# Patient Record
Sex: Female | Born: 1940 | Race: Black or African American | Hispanic: No | Marital: Single | Smoking: Never smoker
Health system: Southern US, Community
[De-identification: ages and names within clinical notes are randomized; demographics above are authoritative.]

---

## 2004-04-16 ENCOUNTER — Ambulatory Visit: Payer: Self-pay

## 2005-04-08 ENCOUNTER — Ambulatory Visit: Payer: Self-pay

## 2006-04-29 ENCOUNTER — Ambulatory Visit: Payer: Self-pay | Admitting: Internal Medicine

## 2007-07-07 ENCOUNTER — Emergency Department: Payer: Self-pay | Admitting: Emergency Medicine

## 2009-06-01 ENCOUNTER — Emergency Department: Payer: Self-pay | Admitting: Emergency Medicine

## 2009-10-12 ENCOUNTER — Ambulatory Visit: Payer: Self-pay | Admitting: Family Medicine

## 2010-08-30 ENCOUNTER — Ambulatory Visit: Payer: Self-pay | Admitting: Family Medicine

## 2011-08-27 ENCOUNTER — Emergency Department: Payer: Self-pay | Admitting: Internal Medicine

## 2011-12-10 ENCOUNTER — Ambulatory Visit: Payer: Self-pay | Admitting: Family Medicine

## 2012-03-02 ENCOUNTER — Ambulatory Visit: Payer: Self-pay | Admitting: Family Medicine

## 2013-03-10 ENCOUNTER — Ambulatory Visit: Payer: Self-pay | Admitting: Family Medicine

## 2016-01-29 ENCOUNTER — Other Ambulatory Visit: Payer: Self-pay | Admitting: Family Medicine

## 2016-01-29 DIAGNOSIS — Z0000 Encounter for general adult medical examination without abnormal findings: Secondary | ICD-10-CM

## 2016-01-30 ENCOUNTER — Other Ambulatory Visit: Payer: Self-pay | Admitting: Family Medicine

## 2016-01-30 DIAGNOSIS — Z1231 Encounter for screening mammogram for malignant neoplasm of breast: Secondary | ICD-10-CM

## 2016-02-27 ENCOUNTER — Ambulatory Visit: Payer: Self-pay | Attending: Family Medicine

## 2016-04-14 ENCOUNTER — Ambulatory Visit
Admission: RE | Admit: 2016-04-14 | Discharge: 2016-04-14 | Disposition: A | Discharge status: Home / Self Care | Payer: Medicare HMO | Source: Ambulatory Visit | Attending: Family Medicine | Admitting: Family Medicine

## 2016-04-14 ENCOUNTER — Encounter: Payer: Self-pay | Admitting: Radiology

## 2016-04-14 DIAGNOSIS — Z13820 Encounter for screening for osteoporosis: Secondary | ICD-10-CM | POA: Insufficient documentation

## 2016-04-14 DIAGNOSIS — M8588 Other specified disorders of bone density and structure, other site: Secondary | ICD-10-CM | POA: Insufficient documentation

## 2016-04-14 DIAGNOSIS — Z780 Asymptomatic menopausal state: Secondary | ICD-10-CM | POA: Diagnosis not present

## 2016-04-14 DIAGNOSIS — Z1231 Encounter for screening mammogram for malignant neoplasm of breast: Secondary | ICD-10-CM | POA: Diagnosis not present

## 2016-04-14 DIAGNOSIS — Z0000 Encounter for general adult medical examination without abnormal findings: Secondary | ICD-10-CM

## 2016-04-14 DIAGNOSIS — M85852 Other specified disorders of bone density and structure, left thigh: Secondary | ICD-10-CM | POA: Insufficient documentation

## 2018-02-26 ENCOUNTER — Other Ambulatory Visit: Payer: Self-pay | Admitting: Family Medicine

## 2018-02-26 DIAGNOSIS — Z1231 Encounter for screening mammogram for malignant neoplasm of breast: Secondary | ICD-10-CM

## 2018-02-26 DIAGNOSIS — Z0000 Encounter for general adult medical examination without abnormal findings: Secondary | ICD-10-CM

## 2018-04-07 ENCOUNTER — Other Ambulatory Visit: Payer: Medicare HMO

## 2018-10-02 ENCOUNTER — Encounter: Payer: Self-pay | Admitting: Emergency Medicine

## 2018-10-02 ENCOUNTER — Emergency Department: Payer: Medicare HMO

## 2018-10-02 ENCOUNTER — Emergency Department
Admission: EM | Admit: 2018-10-02 | Discharge: 2018-10-02 | Disposition: A | Discharge status: Home / Self Care | Payer: Medicare HMO | Attending: Emergency Medicine | Admitting: Emergency Medicine

## 2018-10-02 ENCOUNTER — Other Ambulatory Visit: Payer: Self-pay

## 2018-10-02 DIAGNOSIS — W010XXA Fall on same level from slipping, tripping and stumbling without subsequent striking against object, initial encounter: Secondary | ICD-10-CM | POA: Diagnosis not present

## 2018-10-02 DIAGNOSIS — Y998 Other external cause status: Secondary | ICD-10-CM | POA: Insufficient documentation

## 2018-10-02 DIAGNOSIS — Y9259 Other trade areas as the place of occurrence of the external cause: Secondary | ICD-10-CM | POA: Insufficient documentation

## 2018-10-02 DIAGNOSIS — S8992XA Unspecified injury of left lower leg, initial encounter: Secondary | ICD-10-CM | POA: Diagnosis not present

## 2018-10-02 DIAGNOSIS — Y9301 Activity, walking, marching and hiking: Secondary | ICD-10-CM | POA: Insufficient documentation

## 2018-10-02 DIAGNOSIS — S0990XA Unspecified injury of head, initial encounter: Secondary | ICD-10-CM | POA: Diagnosis present

## 2018-10-02 DIAGNOSIS — S069X9A Unspecified intracranial injury with loss of consciousness of unspecified duration, initial encounter: Secondary | ICD-10-CM

## 2018-10-02 DIAGNOSIS — M25562 Pain in left knee: Secondary | ICD-10-CM

## 2018-10-02 MED ORDER — DICLOFENAC SODIUM 1 % TD GEL: 4.0000 g | Freq: Four times a day (QID) | TRANSDERMAL | 1 refills | Status: DC

## 2018-10-02 NOTE — ED Notes (Addendum)
Jones wrap applied to left knee per CariBeth FNP Pt called for ride home

## 2018-10-02 NOTE — ED Notes (Signed)
Pt reports that she fell in [employer] parking lot at 12 noon - pt c/o left knee pain - left knee appears bruised/slightly swollen/and painful to touch - c/o left shoulder aching - pt reports that she "flipped over" and hit back of head on the cement - denies N/V, dizziness, pain, change in vision - pt reports that she was "unconscious and everyone looking at me when I woke up"

## 2018-10-02 NOTE — ED Provider Notes (Signed)
[facility] EMERGENCY DEPARTMENT Provider Note   CSN: [id number] Arrival date & time: 10/02/18  1256    History   Chief Complaint Chief Complaint  Patient presents with   Knee Pain    HPI Erika Roman is a 78 y.o. female.     78 year old female presents to the emergency department after a mechanical fall with a brief period of loss of consciousness.  [place] [facility] [employer]  The base of the Lane divider was sticking out and she caught her foot on it causing her to fall.  She landed on her left knee.  She also states that she lost consciousness.  When she woke up there were several people standing over her, but she does not recall striking her head.  She also has some pain in the left shoulder but states it is just "sore."  No alleviating measures attempted prior to arrival.     History reviewed. No pertinent past medical history.  There are no active problems to display for this patient.   History reviewed. No pertinent surgical history.   OB History   No obstetric history on file.      Home Medications    Prior to Admission medications   Medication Sig Start Date End Date Taking? Authorizing Provider  diclofenac sodium (VOLTAREN) 1 % GEL Apply 4 g topically 4 (four) times daily. 10/02/18   Chinita Pesterriplett, Shawntia Mangal B, FNP    Family History Family History  Problem Relation Age of Onset   Breast cancer Neg Hx     Social History Social History   Tobacco Use   Smoking status: Not on file  Substance Use Topics   Alcohol use: Not on file   Drug use: Not on file     Allergies   Patient has no known allergies.   Review of Systems Review of Systems  Constitutional: Negative.   HENT: Negative for ear discharge and nosebleeds.   Respiratory: Negative for shortness of breath.   Cardiovascular: Negative for chest pain.  Gastrointestinal: Negative for nausea and vomiting.  Musculoskeletal: Positive for joint swelling and  myalgias. Negative for back pain and neck pain.  Skin: Positive for wound.  Neurological: Positive for syncope.  Psychiatric/Behavioral: Negative for confusion.     Physical Exam Updated Vital Signs BP 132/71    Pulse 75    Temp 99 F (37.2 C) (Oral)    Resp 20    Ht 5\' 3"  (1.6 m)    Wt 61.2 kg    SpO2 97%    BMI 23.91 kg/m   Physical Exam Vitals signs and nursing note reviewed.  Constitutional:      General: She is not in acute distress. HENT:     Head: Normocephalic.     Right Ear: Tympanic membrane normal.     Left Ear: Tympanic membrane normal.     Nose: Nose normal.     Mouth/Throat:     Mouth: Mucous membranes are moist.  Eyes:     Extraocular Movements: Extraocular movements intact.     Pupils: Pupils are equal, round, and reactive to light.  Neck:     Musculoskeletal: Normal range of motion. No neck rigidity or muscular tenderness.  Cardiovascular:     Rate and Rhythm: Normal rate.  Pulmonary:     Effort: Pulmonary effort is normal.     Breath sounds: Normal breath sounds.  Abdominal:     Palpations: Abdomen is soft.  Tenderness: There is no abdominal tenderness. There is no guarding.  Musculoskeletal:     Left knee: She exhibits decreased range of motion, swelling and ecchymosis.  Skin:    General: Skin is warm and dry.     Capillary Refill: Capillary refill takes less than 2 seconds.     Findings: Abrasion and bruising present.       Neurological:     Mental Status: She is alert.  Psychiatric:        Mood and Affect: Mood normal.        Behavior: Behavior normal.        Thought Content: Thought content normal.      ED Treatments / Results  Labs (all labs ordered are listed, but only abnormal results are displayed) Labs Reviewed - No data to display  EKG None  Radiology Ct Head Wo Contrast  Result Date: 10/02/2018 CLINICAL DATA:  Posttraumatic headache EXAM: CT HEAD WITHOUT CONTRAST CT CERVICAL SPINE WITHOUT CONTRAST TECHNIQUE:  Multidetector CT imaging of the head and cervical spine was performed following the standard protocol without intravenous contrast. Multiplanar CT image reconstructions of the cervical spine were also generated. COMPARISON:  None. FINDINGS: CT HEAD FINDINGS Brain: No evidence of acute infarction, hemorrhage, hydrocephalus, extra-axial collection or mass lesion/mass effect. Widened parieto-occipital and left frontal sulci, nonspecific. No evidence of subdural collection. Vascular: No hyperdense vessel or unexpected calcification. Skull: Normal. Negative for fracture or focal lesion. Sinuses/Orbits: No acute finding. Other: Pervasive streak artifact from hair pins which the patient declined to remove. These definitely degrades sensitivity. CT CERVICAL SPINE FINDINGS Alignment: Mild C6-7 anterolisthesis considered degenerative. Facets are located. Skull base and vertebrae: Negative for acute fracture Soft tissues and spinal canal: No prevertebral fluid or swelling. No visible canal hematoma. Or Disc levels: Generalized degenerative spurring and disc narrowing. Degenerative facet spurring asymmetric to the left. Upper chest: Negative IMPRESSION: 1. No evidence of intracranial injury or cervical spine fracture. 2. Extensive head CT artifact due to hair pins which the patient declined to remove. Electronically Signed   By: Marnee Spring M.D.   On: 10/02/2018 15:07   Ct Cervical Spine Wo Contrast  Result Date: 10/02/2018 CLINICAL DATA:  Posttraumatic headache EXAM: CT HEAD WITHOUT CONTRAST CT CERVICAL SPINE WITHOUT CONTRAST TECHNIQUE: Multidetector CT imaging of the head and cervical spine was performed following the standard protocol without intravenous contrast. Multiplanar CT image reconstructions of the cervical spine were also generated. COMPARISON:  None. FINDINGS: CT HEAD FINDINGS Brain: No evidence of acute infarction, hemorrhage, hydrocephalus, extra-axial collection or mass lesion/mass effect. Widened  parieto-occipital and left frontal sulci, nonspecific. No evidence of subdural collection. Vascular: No hyperdense vessel or unexpected calcification. Skull: Normal. Negative for fracture or focal lesion. Sinuses/Orbits: No acute finding. Other: Pervasive streak artifact from hair pins which the patient declined to remove. These definitely degrades sensitivity. CT CERVICAL SPINE FINDINGS Alignment: Mild C6-7 anterolisthesis considered degenerative. Facets are located. Skull base and vertebrae: Negative for acute fracture Soft tissues and spinal canal: No prevertebral fluid or swelling. No visible canal hematoma. Or Disc levels: Generalized degenerative spurring and disc narrowing. Degenerative facet spurring asymmetric to the left. Upper chest: Negative IMPRESSION: 1. No evidence of intracranial injury or cervical spine fracture. 2. Extensive head CT artifact due to hair pins which the patient declined to remove. Electronically Signed   By: Marnee Spring M.D.   On: 10/02/2018 15:07   Dg Knee Complete 4 Views Left  Result Date: 10/02/2018 CLINICAL DATA:  Fall today,  left knee pain EXAM: LEFT KNEE - COMPLETE 4+ VIEW COMPARISON:  None. FINDINGS: Lateral and prepatellar left knee soft tissue swelling. No fracture, joint effusion or dislocation. No suspicious focal osseous lesion. No significant arthropathy. Small superior left patellar enthesophyte. No radiopaque foreign body. IMPRESSION: Lateral and prepatellar left knee soft tissue swelling, with no fracture, joint effusion or malalignment. Electronically Signed   By: Delbert Phenix M.D.   On: 10/02/2018 14:23    Procedures Procedures (including critical care time)  Medications Ordered in ED Medications - No data to display   Initial Impression / Assessment and Plan / ED Course  I have reviewed the triage vital signs and the nursing notes.  Pertinent labs & imaging results that were available during my care of the patient were reviewed by me and  considered in my medical decision making (see chart for details).      78 year old female presenting to the emergency department status post syncope secondary to mechanical fall.  Patient denies chest pain, shortness of breath, or weakness in the extremities.  No neurological deficits that would indicate a more sinister reason for the syncopal episode.  She is currently awake, alert, and oriented x4.  Her main complaint is left knee pain.  CT of the head and cervical spine have been ordered as well as diagnostic imaging of the left knee.  ----------------------------------------- 3:28 PM on 10/02/2018 -----------------------------------------  CT of the head is somewhat skewed due to artifact related to hairpins. Patient adamantly refuses to remove the pins.  At this time, the patient is alert, oriented x4 and is now denying headache.  Her exam is reassuring.  She will be discharged home as originally planned and will receive head injury instructions as well as strict ER return precautions.  She has been advised not to be alone for the next 24 hours.  For any symptom of concern she is to return to the emergency department.  Her left knee was placed in a Jones wrap and she is to rest, ice, and elevate it.  If not improving over the week.  She is to see her primary care provider.  She will be prescribed Voltaren gel to be used 4 times a day if needed for pain.  Final Clinical Impressions(s) / ED Diagnoses   Final diagnoses:  Acute pain of left knee  Acute head injury with loss of consciousness, initial encounter [facility])    ED Discharge Orders         Ordered    diclofenac sodium (VOLTAREN) 1 % GEL  4 times daily     10/02/18 1521           Chinita Pester, FNP 10/02/18 1532    Sharyn Creamer, MD 10/02/18 1605

## 2018-10-02 NOTE — Discharge Instructions (Signed)
Please call and schedule follow-up appointment with your primary care provider for symptoms that are not improving over the next week or so.  It is not recommended that you stay alone for the next 24 hours due to the loss of consciousness when you fell.  Return to the emergency department immediately for any concerns.

## 2018-10-02 NOTE — ED Triage Notes (Signed)
L knee pain since fell approx 1 hour ago.

## 2019-12-09 IMAGING — CT CT HEAD WITHOUT CONTRAST
4 of 8 series · 13 of 47 positions shown, 14 images · non-contrast
Comparison: None.

CLINICAL DATA: Posttraumatic headache

EXAM:
CT HEAD WITHOUT CONTRAST
CT CERVICAL SPINE WITHOUT CONTRAST
TECHNIQUE: Multidetector CT imaging of the head and cervical spine was
performed following the standard protocol without intravenous
contrast. Multiplanar CT image reconstructions of the cervical spine
were also generated.

[Series 2: head (person_name) (person_name) · axial · 0.45mm/px · z∈[-55,+20]mm · 3 of 31 slices shown, 4 images]
[im 8/31  brain]
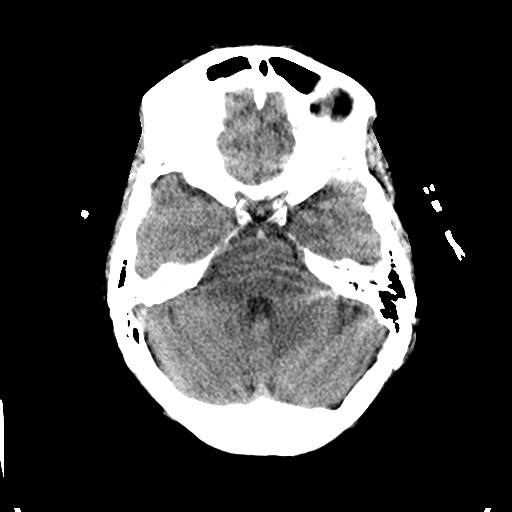
[im 8/31  bone]
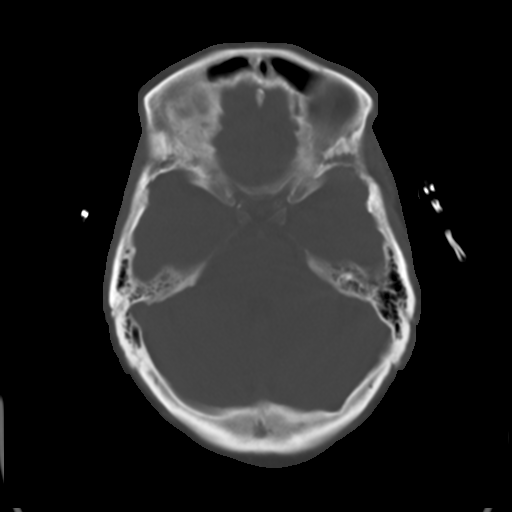
[im 16/31  brain]
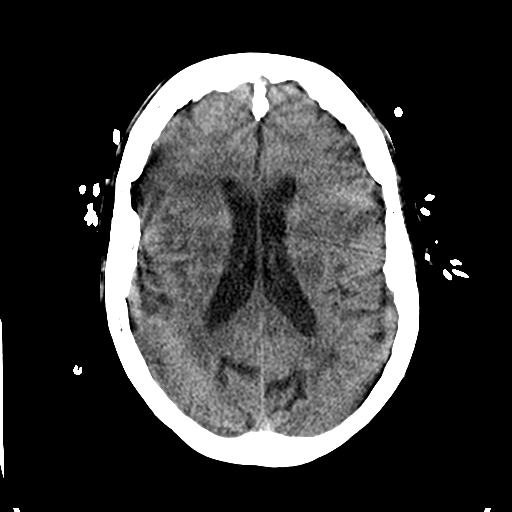
[im 23/31  brain]
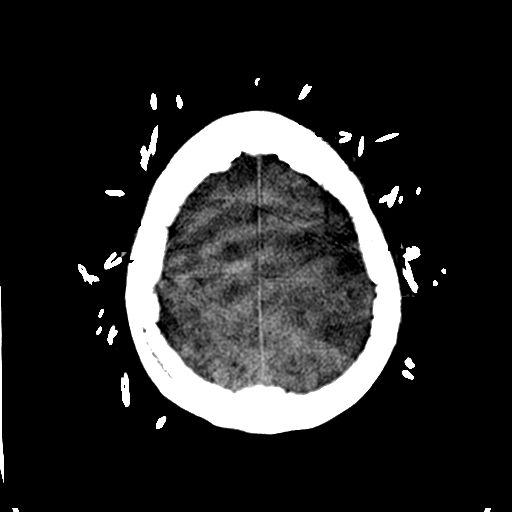

[Series 5: sagittal soft tissue · sagittal · 0.29mm/px · 2 of 48 slices shown]
[im 16/48  brain]
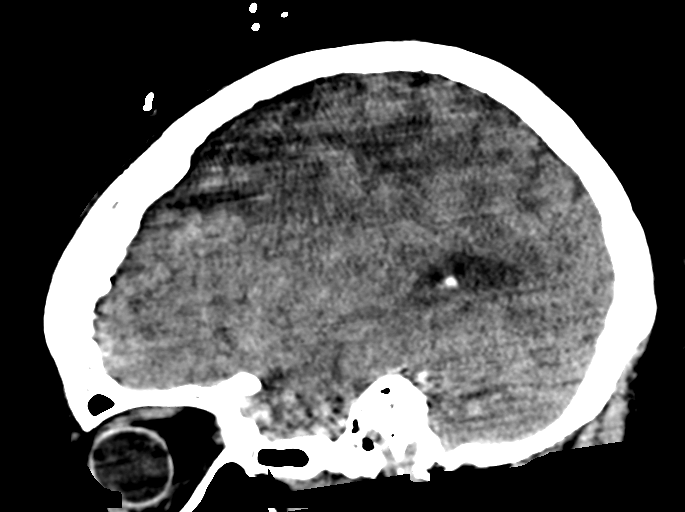
[im 32/48  brain]
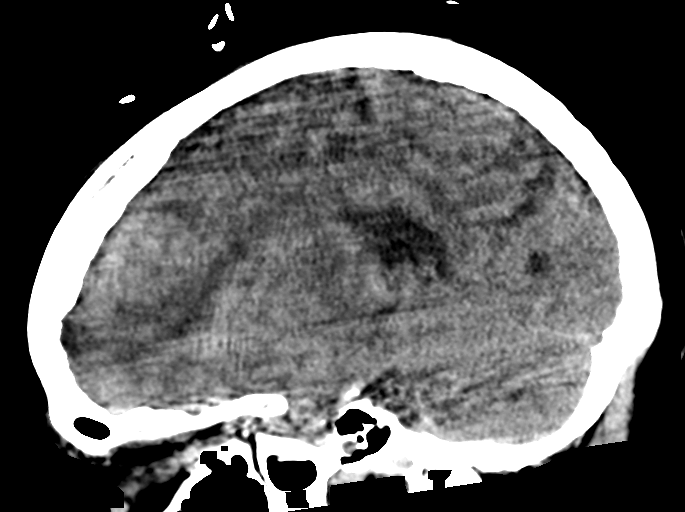

[Series 9: coronal bone · coronal · 0.19mm/px · 3 of 38 slices shown]
[im 8/38  brain]
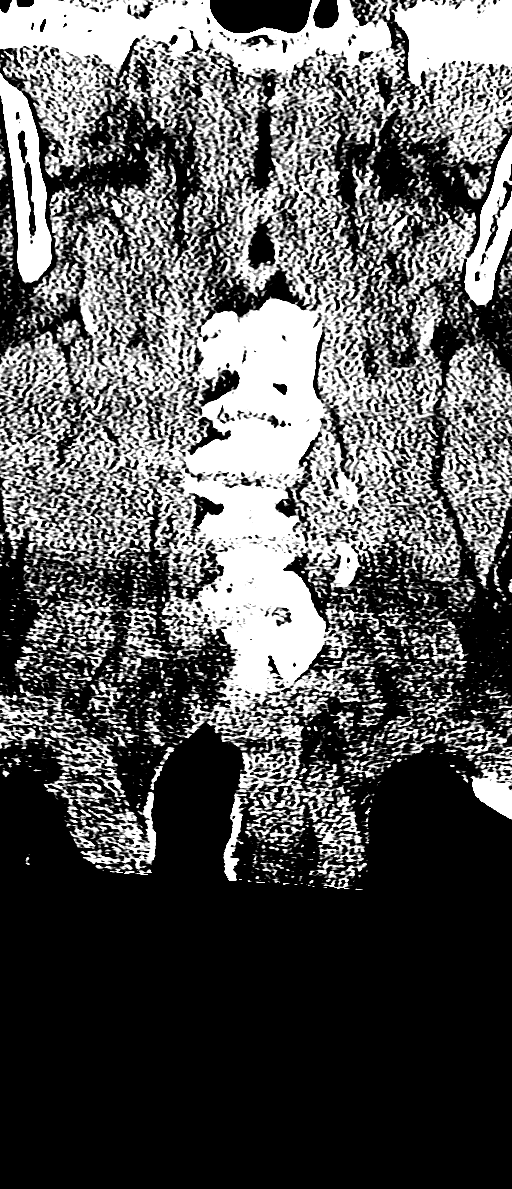
[im 15/38  brain]
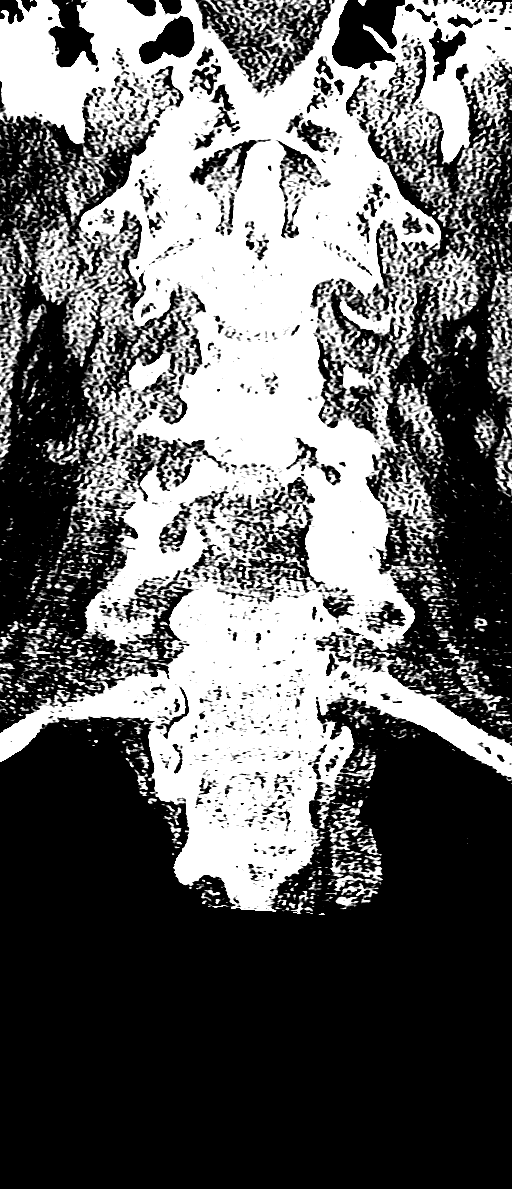
[im 23/38  brain]
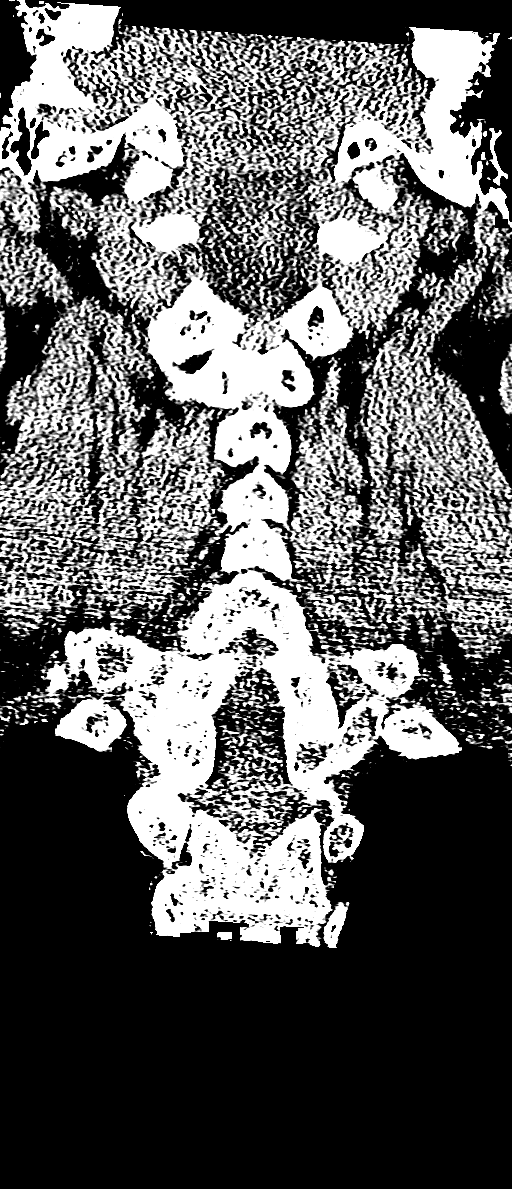

[Series 10: orthogonal bone · axial · 0.21mm/px · z∈[-262,-178]mm · 5 of 91 slices shown]
[im 8/91  bone]
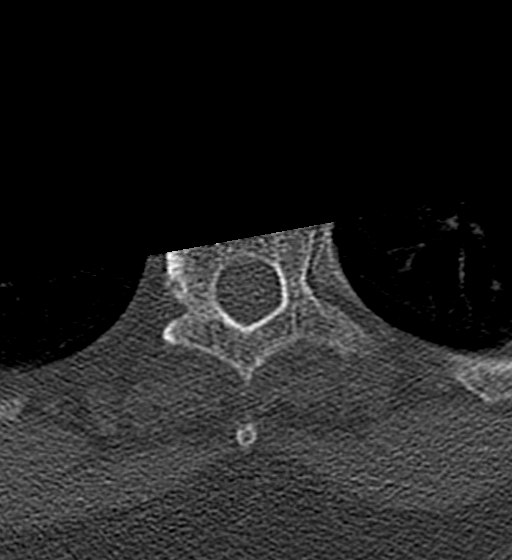
[im 23/91  bone]
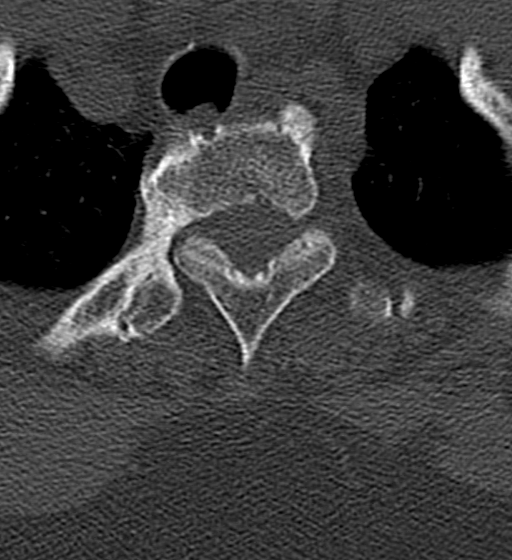
[im 31/91  bone]
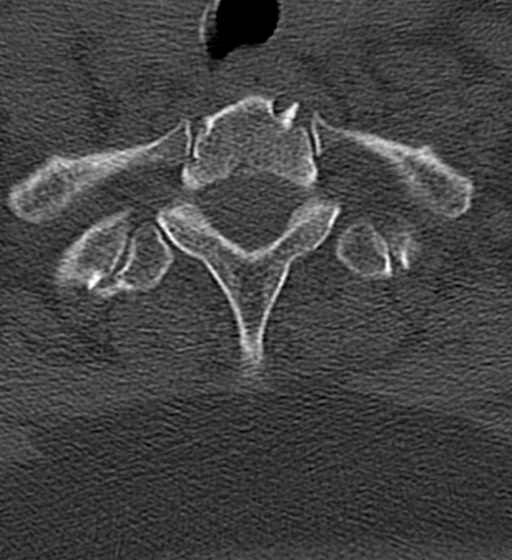
[im 38/91  bone]
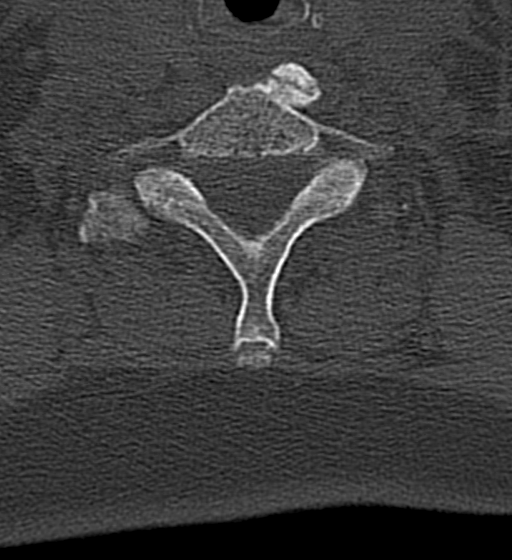
[im 53/91  bone]
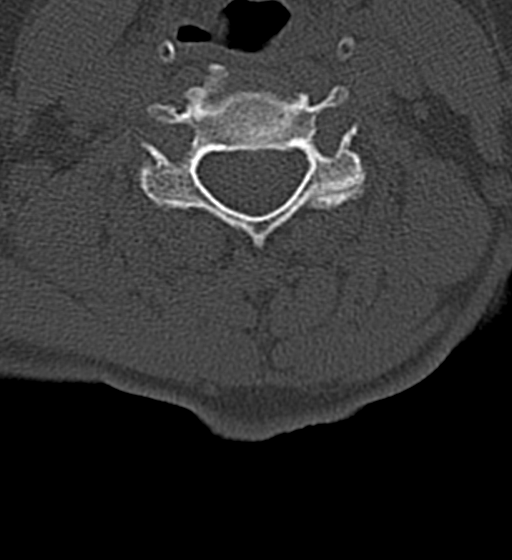

[13 of 47 positions shown; findings below may reference images not displayed]

FINDINGS: CT HEAD FINDINGS

Brain: No evidence of acute infarction, hemorrhage, hydrocephalus,
extra-axial collection or mass lesion/mass effect. Widened
parieto-occipital and left frontal sulci, nonspecific. No evidence
of subdural collection.

Vascular: No hyperdense vessel or unexpected calcification.

Skull: Normal. Negative for fracture or focal lesion.

Sinuses/Orbits: No acute finding.

Other: Pervasive streak artifact from hair pins which the patient
declined to remove. These definitely degrades sensitivity.

CT CERVICAL SPINE FINDINGS

Alignment: Mild C6-7 anterolisthesis considered degenerative. Facets
are located.

Skull base and vertebrae: Negative for acute fracture

Soft tissues and spinal canal: No prevertebral fluid or swelling. No
visible canal hematoma. Or

Disc levels: Generalized degenerative spurring and disc narrowing.
Degenerative facet spurring asymmetric to the left.

Upper chest: Negative
IMPRESSION: 1. No evidence of intracranial injury or cervical spine fracture.
2. Extensive head CT artifact due to hair pins which the patient
declined to remove.

## 2021-11-30 DIAGNOSIS — Z1389 Encounter for screening for other disorder: Secondary | ICD-10-CM | POA: Diagnosis not present

## 2021-11-30 DIAGNOSIS — M79609 Pain in unspecified limb: Secondary | ICD-10-CM | POA: Diagnosis not present

## 2021-12-29 ENCOUNTER — Other Ambulatory Visit: Payer: Self-pay

## 2021-12-29 ENCOUNTER — Encounter: Payer: Self-pay | Admitting: Emergency Medicine

## 2021-12-29 ENCOUNTER — Inpatient Hospital Stay
Admission: EM | Admit: 2021-12-29 | Discharge: 2021-12-31 | DRG: 556 | Disposition: A | Discharge status: Home / Self Care | Payer: Medicare HMO | Attending: Internal Medicine | Admitting: Internal Medicine

## 2021-12-29 ENCOUNTER — Emergency Department: Payer: Medicare HMO

## 2021-12-29 DIAGNOSIS — M79661 Pain in right lower leg: Secondary | ICD-10-CM | POA: Diagnosis not present

## 2021-12-29 DIAGNOSIS — E785 Hyperlipidemia, unspecified: Secondary | ICD-10-CM | POA: Diagnosis present

## 2021-12-29 DIAGNOSIS — I743 Embolism and thrombosis of arteries of the lower extremities: Secondary | ICD-10-CM | POA: Diagnosis not present

## 2021-12-29 DIAGNOSIS — I739 Peripheral vascular disease, unspecified: Secondary | ICD-10-CM | POA: Diagnosis present

## 2021-12-29 DIAGNOSIS — M79606 Pain in leg, unspecified: Secondary | ICD-10-CM | POA: Diagnosis present

## 2021-12-29 DIAGNOSIS — I771 Stricture of artery: Secondary | ICD-10-CM | POA: Diagnosis present

## 2021-12-29 DIAGNOSIS — I70211 Atherosclerosis of native arteries of extremities with intermittent claudication, right leg: Secondary | ICD-10-CM | POA: Diagnosis not present

## 2021-12-29 DIAGNOSIS — I998 Other disorder of circulatory system: Secondary | ICD-10-CM | POA: Diagnosis not present

## 2021-12-29 DIAGNOSIS — M79604 Pain in right leg: Principal | ICD-10-CM

## 2021-12-29 DIAGNOSIS — M79671 Pain in right foot: Secondary | ICD-10-CM | POA: Diagnosis present

## 2021-12-29 DIAGNOSIS — R269 Unspecified abnormalities of gait and mobility: Secondary | ICD-10-CM | POA: Diagnosis not present

## 2021-12-29 DIAGNOSIS — I10 Essential (primary) hypertension: Secondary | ICD-10-CM | POA: Diagnosis not present

## 2021-12-29 LAB — APTT: aPTT: 32 seconds (ref 24–36)

## 2021-12-29 LAB — CBC WITH DIFFERENTIAL/PLATELET
Abs Immature Granulocytes: 0.01 10*3/uL (ref 0.00–0.07)
Basophils Absolute: 0 10*3/uL (ref 0.0–0.1)
Basophils Relative: 0 %
Eosinophils Absolute: 0.1 10*3/uL (ref 0.0–0.5)
Eosinophils Relative: 2 %
HCT: 38.8 % (ref 36.0–46.0)
Hemoglobin: 12.5 g/dL (ref 12.0–15.0)
Immature Granulocytes: 0 %
Lymphocytes Relative: 38 %
Lymphs Abs: 1.9 10*3/uL (ref 0.7–4.0)
MCH: 30 pg (ref 26.0–34.0)
MCHC: 32.2 g/dL (ref 30.0–36.0)
MCV: 93 fL (ref 80.0–100.0)
Monocytes Absolute: 0.3 10*3/uL (ref 0.1–1.0)
Monocytes Relative: 5 %
Neutro Abs: 2.7 10*3/uL (ref 1.7–7.7)
Neutrophils Relative %: 55 %
Platelets: 319 10*3/uL (ref 150–400)
RBC: 4.17 MIL/uL (ref 3.87–5.11)
RDW: 12.5 % (ref 11.5–15.5)
WBC: 5 10*3/uL (ref 4.0–10.5)
nRBC: 0 % (ref 0.0–0.2)

## 2021-12-29 LAB — PROTIME-INR
INR: 1 (ref 0.8–1.2)
Prothrombin Time: 13.5 seconds (ref 11.4–15.2)

## 2021-12-29 LAB — BASIC METABOLIC PANEL
Anion gap: 10 (ref 5–15)
BUN: 12 mg/dL (ref 8–23)
CO2: 27 mmol/L (ref 22–32)
Calcium: 9.5 mg/dL (ref 8.9–10.3)
Chloride: 104 mmol/L (ref 98–111)
Creatinine, Ser: 0.59 mg/dL (ref 0.44–1.00)
GFR, Estimated: >60 mL/min (ref >60)
Glucose, Bld: 99 mg/dL (ref 70–99)
Potassium: 3.7 mmol/L (ref 3.5–5.1)
Sodium: 141 mmol/L (ref 135–145)

## 2021-12-29 MED ORDER — HEPARIN (PORCINE) 25000 UT/250ML-% IV SOLN
1000.0000 [IU]/h | INTRAVENOUS | Status: DC
  Administered 2021-12-29: 1000 [IU]/h via INTRAVENOUS
  Filled 2021-12-29: qty 250

## 2021-12-29 MED ORDER — IOHEXOL 350 MG/ML SOLN
100.0000 mL | Freq: Once | INTRAVENOUS | Status: AC | PRN
  Administered 2021-12-29: 100 mL via INTRAVENOUS

## 2021-12-29 MED ORDER — MORPHINE SULFATE (PF) 2 MG/ML IV SOLN
2.0000 mg | Freq: Once | INTRAVENOUS | Status: AC
  Administered 2021-12-29: 2 mg via INTRAVENOUS
  Filled 2021-12-29: qty 1

## 2021-12-29 MED ORDER — HEPARIN BOLUS VIA INFUSION
3500.0000 [IU] | Freq: Once | INTRAVENOUS | Status: AC
  Administered 2021-12-29: 3500 [IU] via INTRAVENOUS
  Filled 2021-12-29: qty 3500

## 2021-12-29 NOTE — Consult Note (Signed)
VASCULAR SURGERY CONSULTATION   Requested by:  Alfonse Flavors, MD [facility] ED)  Reason for consultation: RIGHT leg pain    History of Present Illness   Erika Roman is a 81 y.o. ([birth year]) female who presents with cc: RIGHT leg pain.  Pt notes sudden onset of pain RIGHT lower leg roughly 3 am.  Pt had woke up to go to the bathroom when she noted a severe pain in RIGHT lower leg.  Pt denies neuropathic sx.  Pt notes a vague "toothache" like pain.  Pt denies any motor or sensation loss.  Pt denies any prior history of claudication or rest pain.  Pt denies any recent cardiac arrhythmia.  History reviewed. No pertinent past medical history.  History reviewed. No pertinent surgical history.   Social History   Socioeconomic History   Marital status: Single    Spouse name: Not on file   Number of children: Not on file   Years of education: Not on file   Highest education level: Not on file  Occupational History   Not on file  Tobacco Use   Smoking status: Not on file   Smokeless tobacco: Not on file  Substance and Sexual Activity   Alcohol use: Not on file   Drug use: Not on file   Sexual activity: Not on file  Other Topics Concern   Not on file  Social History Narrative   Not on file   Social Determinants of Health   Financial Resource Strain: Not on file  Food Insecurity: Not on file  Transportation Needs: Not on file  Physical Activity: Not on file  Stress: Not on file  Social Connections: Not on file  Intimate Partner Violence: Not on file    Family History  Problem Relation Age of Onset   Breast cancer Neg Hx     Current Facility-Administered Medications  Medication Dose Route Frequency Provider Last Rate Last Admin   heparin ADULT infusion 100 units/mL (25000 units/282mL)  1,000 Units/hr Intravenous Continuous Jaynie Bream, RPH       heparin bolus via infusion 3,500 Units  3,500 Units Intravenous Once Jaynie Bream, [facility]       Current  Outpatient Medications  Medication Sig Dispense Refill   diclofenac sodium (VOLTAREN) 1 % GEL Apply 4 g topically 4 (four) times daily. 1 Tube 1    No Known Allergies  REVIEW OF SYSTEMS (negative unless checked):   Cardiac:  []  Chest pain or chest pressure? []  Shortness of breath upon activity? []  Shortness of breath when lying flat? [x]  Irregular heart rhythm?  Vascular:  [x]  Pain in calf, thigh, or hip brought on by walking? []  Pain in feet at night that wakes you up from your sleep? []  Blood clot in your veins? []  Leg swelling?  Pulmonary:  []  Oxygen at home? []  Productive cough? []  Wheezing?  Neurologic:  []  Sudden weakness in arms or legs? []  Sudden numbness in arms or legs? []  Sudden onset of difficult speaking or slurred speech? []  Temporary loss of vision in one eye? []  Problems with dizziness?  Gastrointestinal:  []  Blood in stool? []  Vomited blood?  Genitourinary:  []  Burning when urinating? []  Blood in urine?  Psychiatric:  []  Major depression  Hematologic:  []  Bleeding problems? []  Problems with blood clotting?  Dermatologic:  []  Rashes or ulcers?  Constitutional:  []  Fever or chills?  Ear/Nose/Throat:  []  Change in hearing? []  Nose bleeds? []  Sore throat?  Musculoskeletal:  []  Back pain? [x]  Joint pain? [x]  Muscle pain?   Physical Examination     Vitals:   12/29/21 1257 12/29/21 1849 12/29/21 1900 12/29/21 1930  BP: (!) 173/85  (!) 196/80 (!) 167/69  Pulse: 79  (!) 56 (!) 54  Resp: 20  14 18   Temp: 98.3 F (36.8 C) 98.3 F (36.8 C)    TempSrc: Oral Oral    SpO2: 96%  96% 96%  Weight: 56.7 kg     Height: 5\' 3"  (1.6 m)      Body mass index is 22.14 kg/m.  General Alert, O x 3, WD, NAD  Head [place]/AT,    Ear/Nose/ Throat Hearing grossly intact, nares without erythema or drainage, oropharynx without Erythema or Exudate, Mallampati score: 3,   Eyes PERRLA, EOMI,    Neck Supple, mid-line trachea,    Pulmonary Sym exp, good  B air movt, CTA B  Cardiac RRR, Nl S1, S2, no Murmurs, No rubs, No S3,S4  Vascular Vessel Right Left  Radial Palpable Palpable  Brachial Palpable Palpable  Carotid Palpable, No Bruit Palpable, No Bruit  Aorta Not palpable N/A  Femoral Palpable Palpable  Popliteal Not palpable Not palpable  PT Palpable Palpable  DP Not palpable Palpable    Gastro- intestinal soft, non-distended, non-tender to palpation, No guarding or rebound, no HSM, no masses, no CVAT B, No palpable prominent aortic pulse,    Musculo- skeletal M/S 5/5 throughout  , Extremities without ischemic changes  , No edema present,  Neurologic Cranial nerves 2-12 intact , Pain and light touch intact in extremities , Motor exam as listed above  Psychiatric Judgement intact, Mood & affect appropriate for pt's clinical situation  Dermatologic See M/S exam for extremity exam, No rashes otherwise noted  Lymphatic  Palpable lymph nodes: None    Laboratory   CBC    Latest Ref Rng & Units 12/29/2021    6:45 PM  CBC  WBC 4.0 - 10.5 K/uL 5.0   Hemoglobin 12.0 - 15.0 g/dL 12/31/21   Hematocrit 12/31/21 - 46.0 % 38.8   Platelets 150 - 400 K/uL 319     BMP    Latest Ref Rng & Units 12/29/2021    6:45 PM  BMP  Glucose 70 - 99 mg/dL 99   BUN 8 - 23 mg/dL 12   Creatinine - 1.00 mg/dL 12/31/2021   Sodium 83.3 - 82.5 mmol/L 141   Potassium 3.5 - 5.1 mmol/L 3.7   Chloride 98 - 111 mmol/L 104   CO2 22 - 32 mmol/L 27   Calcium 8.9 - 10.3 mg/dL 9.5     Coagulation Lab Results  Component Value Date   INR 1.0 12/29/2021    Radiology     CT ANGIO LOW EXTREM RIGHT W &/OR WO CONTRAST  Result Date: 12/29/2021 CLINICAL DATA:  Claudication of right leg. EXAM: CT ANGIOGRAPHY LOWER RIGHT EXTREMITY TECHNIQUE: Multi detector CT imaging of the right lower extremity was performed using the standard protocol during bolus administration of intravenous contrast. Multiplanar CT image reconstruction and MIPS were obtained to evaluate the vascular  anatomy. CONTRAST:  9.76 OMNIPAQUE IOHEXOL 350 MG/ML SOLN COMPARISON:  None Available. FINDINGS: There is no evidence of an acute fracture, dislocation or joint effusion. Right common femoral artery: Free of significant atherosclerosis. The right common femoral bifurcates into the superficial femoral and deep (profundus) femoral arteries. Right Profundus femoris: Free of significant atherosclerosis. Right Superficial femoral: Free of significant atherosclerosis. Right Popliteal artery: Free  of significant atherosclerosis. The popliteal artery bifurcates into the anterior tibial and tibioperoneal trunk. Right tibioperoneal trunk: Free of significant atherosclerosis. Right anterior tibial artery: Segments of distal hemodynamically significant stenosis and arterial occlusion are noted (axial CT images 329 through 481, CT series 5). Right posterior tibial artery: Free of significant atherosclerosis, continuous to the ankle. Right peroneal artery: Free of significant atherosclerosis, continuous to the ankle. Review of the MIP images confirms the above findings. IMPRESSION: 1. 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 Further evaluation with conventional angiography is recommended. Electronically Signed   By: Aram Candela M.D.   On: 12/29/2021 20:46   US Venous Img Lower Unilateral Right  Result Date: 12/29/2021 CLINICAL DATA:  Right calf pain. EXAM: RIGHT LOWER EXTREMITY VENOUS DOPPLER ULTRASOUND TECHNIQUE: Gray-scale sonography with compression, as well as color and duplex ultrasound, were performed to evaluate the deep venous system(s) from the level of the common femoral vein through the popliteal and proximal calf veins. COMPARISON:  None Available. FINDINGS: VENOUS Normal compressibility of the common femoral, superficial femoral, and popliteal veins, as well as the visualized calf veins. Visualized portions of profunda femoral vein and great  saphenous vein unremarkable. No filling defects to suggest DVT on grayscale or color Doppler imaging. Doppler waveforms show normal direction of venous flow, normal respiratory plasticity and response to augmentation. Limited views of the contralateral common femoral vein are unremarkable. OTHER None. Limitations: none IMPRESSION: Negative. Electronically Signed   By: Beckie Roman M.D.   On: 12/29/2021 14:59    I reviewed pt's RLE CTA: - no femoropopliteal occlusion - 2 out 3 tibial arteries intact - Loss of ATA distally   Medical Decision Making   Erika Roman is a 81 y.o. female who presents with: RLE pain, likely distal ATA occlusion  Pt has 2 out of 3 tibial arteries patent with intact motor and sensation. She has NO evidence of any threatened limb or hemodynamically significant PAD. Unclear to me etiology of her RIGHT leg pain. Dr. Wyn Quaker will be back in the AM and can provide a second opinion At this point, I doubt any benefit to R leg runoff  Leonides Sake, MD, FACS, FSVS Covering for [facility]: [phone]  12/29/2021, 9:43 PM

## 2021-12-29 NOTE — ED Provider Triage Note (Signed)
Emergency Medicine Provider Triage Evaluation Note  Erika Roman , a 81 y.o. female  was evaluated in triage.  Pt complains of right calf pain for the last 3 days.  Patient denies any injury, trauma, or falls.  She reported to a local urgent care prior to arrival.  She presents to the ED with concern for possible DVT but she denies any chest pain, shortness of breath, cough, or hemoptysis.  Review of Systems  Positive: RLE edema/pain Negative: CP, SOB  Physical Exam  BP (!) 173/85 (BP Location: Left Arm)   Pulse 79   Temp 98.3 F (36.8 C) (Oral)   Resp 20   Ht 5\' 3"  (1.6 m)   Wt 56.7 kg   SpO2 96%   BMI 22.14 kg/m  Gen:   Awake, no distress  NAD Resp:  Normal effort CTA MSK:   Moves extremities without difficulty  CVS:  RRR  Medical Decision Making  Medically screening exam initiated at 2:04 PM.  Appropriate orders placed.  Erika Roman was informed that the remainder of the evaluation will be completed by another provider, this initial triage assessment does not replace that evaluation, and the importance of remaining in the ED until their evaluation is complete.  Geriatric patient to the ED for evaluation of 3 days of right leg pain patient presents with concern for DVT.   Merri Ray, PA-C 12/29/21 1405

## 2021-12-29 NOTE — ED Triage Notes (Signed)
Pt reports pain to right leg for 3 days, denies injuries. Pt went to UC and was advised to come to the ED to r/o DVT

## 2021-12-29 NOTE — Progress Notes (Signed)
ANTICOAGULATION CONSULT NOTE - Initial Consult  Pharmacy Consult for IV heparin Indication:  ischemic limb  No Known Allergies  Patient Measurements: Height: 5\' 3"  (160 cm) Weight: 56.7 kg (125 lb) IBW/kg (Calculated) : 52.4 Heparin Dosing Weight: 56.7 kg  Vital Signs: Temp: 98.3 F (36.8 C) (08/27 1849) Temp Source: Oral (08/27 1849) BP: 167/69 (08/27 1930) Pulse Rate: 54 (08/27 1930)  Labs: Recent Labs    12/29/21 1845  HGB 12.5  HCT 38.8  PLT 319  LABPROT 13.5  INR 1.0  CREATININE 0.59    Estimated Creatinine Clearance: 45.6 mL/min (by C-G formula based on SCr of 0.59 mg/dL).   Medical History: History reviewed. No pertinent past medical history.  Medications:  (Not in a hospital admission)  Scheduled:   heparin  3,500 Units Intravenous Once   Infusions:   heparin     Not on PTA anticoagulation per my chart review   Assessment: 81 year old female presenting with right leg pain.Starting heparin for ischemic limb. Baseline labs stable.  Goal of Therapy:  Heparin level 0.3-0.7 units/ml Monitor platelets by anticoagulation protocol: Yes   Plan:  Give 3500 units bolus x 1 Start heparin infusion at 1,000 units/hr Check anti-Xa level in 8 hours and daily while on heparin Continue to monitor H&H and platelets  94 12/29/2021,9:18 PM

## 2021-12-29 NOTE — H&P (Signed)
History and Physical    Erika Roman IWP:[id number] DOB: Nov 29, 1940 DOA: 12/29/2021  PCP: [facility]  Patient coming from: home  I have personally briefly reviewed patient's old medical records in [facility]  Chief Complaint: right leg pain x 2 days  HPI: Erika Roman is a 81 y.o. female with no significant medical history who presents with right lower extremity pain x 2-3 days with worsening symptoms over the last 24 hours. Patient notes no history of trauma but notes pain made it difficult to ambulate or complete ADLS. She notes not associated swelling or redness, no fever/ chills/ sob/ chest pain n/v. She denies prior history likes this. Does note mild pain with walking over the last few weeks but nothing as severe as the last few days.  She denies tobacco abuse, she denies any personal history of cva/cardiac disease / clots or family history of such.    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 At that time vascular Dr Wyn Quaker consulted who recommended heparin with plans for vascular intervention in am.   Vitals: afeb, bp 173/85, hr 79, rr 20  sat 96% on ra   U/s right lower ext  Neg dvt  CTA IMPRESSION: 1. 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 Further evaluation with conventional angiography is recommended.   Labs: Wbc 5, hgb 12.4, plt319,  NA 141, K 3.7, cr 0.59   Tx morphine, heparin  Review of Systems: As per HPI otherwise 10 point review of systems negative.  History reviewed. No pertinent past medical history.  History reviewed. No pertinent surgical history.   has no history on file for tobacco use, alcohol use, and drug use.  No Known Allergies  Family History  Problem Relation Age of Onset   Breast cancer Neg Hx     Prior to Admission  medications   Medication Sig Start Date End Date Taking? Authorizing Provider  diclofenac sodium (VOLTAREN) 1 % GEL Apply 4 g topically 4 (four) times daily. 10/02/18   Chinita Pester, FNP    Physical Exam: Vitals:   12/29/21 1257 12/29/21 1849 12/29/21 1900 12/29/21 1930  BP: (!) 173/85  (!) 196/80 (!) 167/69  Pulse: 79  (!) 56 (!) 54  Resp: 20  14 18   Temp: 98.3 F (36.8 C) 98.3 F (36.8 C)    TempSrc: Oral Oral    SpO2: 96%  96% 96%  Weight: 56.7 kg     Height: 5\' 3"  (1.6 m)        Vitals:   12/29/21 1257 12/29/21 1849 12/29/21 1900 12/29/21 1930  BP: (!) 173/85  (!) 196/80 (!) 167/69  Pulse: 79  (!) 56 (!) 54  Resp: 20  14 18   Temp: 98.3 F (36.8 C) 98.3 F (36.8 C)    TempSrc: Oral Oral    SpO2: 96%  96% 96%  Weight: 56.7 kg     Height: 5\' 3"  (1.6 m)     Constitutional: NAD, calm, comfortable Eyes: PERRL, lids and conjunctivae normal ENMT: Mucous membranes are moist. Posterior pharynx clear of any exudate or lesions. Neck: normal, supple, no masses, no thyromegaly Respiratory: clear to auscultation bilaterally, no wheezing, no crackles. Normal respiratory effort. No accessory muscle use.  Cardiovascular: Regular rate and rhythm, no murmurs / rubs / gallops. No extremity edema. 2+ pedal pulses on left,  no palpable dorsal pedis on right.  Right foot cool but not specifically cyanotic  Abdomen: no tenderness, no masses palpated. No hepatosplenomegaly. Bowel sounds positive.  Musculoskeletal: no clubbing / cyanosis. No joint deformity upper and lower extremities. Good ROM, no contractures. Normal muscle tone.  Skin: no rashes, lesions, ulcers. No induration Neurologic: CN 2-12 grossly intact. Sensation intact, DTR normal. Strength 5/5 in all 4.  Psychiatric: Normal judgment and insight. Alert and oriented x 3. Normal mood.    Labs on Admission: I have personally reviewed following labs and imaging studies  CBC: Recent Labs  Lab 12/29/21 1845  WBC 5.0  NEUTROABS  2.7  HGB 12.5  HCT 38.8  MCV 93.0  PLT 319   Basic Metabolic Panel: Recent Labs  Lab 12/29/21 1845  NA 141  K 3.7  CL 104  CO2 27  GLUCOSE 99  BUN 12  CREATININE 0.59  CALCIUM 9.5   GFR: Estimated Creatinine Clearance: 45.6 mL/min (by C-G formula based on SCr of 0.59 mg/dL). Liver Function Tests: No results for input(s): "AST", "ALT", "ALKPHOS", "BILITOT", "PROT", "ALBUMIN" in the last 168 hours. No results for input(s): "LIPASE", "AMYLASE" in the last 168 hours. No results for input(s): "AMMONIA" in the last 168 hours. Coagulation Profile: Recent Labs  Lab 12/29/21 1845  INR 1.0   Cardiac Enzymes: No results for input(s): "CKTOTAL", "CKMB", "CKMBINDEX", "TROPONINI" in the last 168 hours. BNP (last 3 results) No results for input(s): "PROBNP" in the last 8760 hours. HbA1C: No results for input(s): "HGBA1C" in the last 72 hours. CBG: No results for input(s): "GLUCAP" in the last 168 hours. Lipid Profile: No results for input(s): "CHOL", "HDL", "LDLCALC", "TRIG", "CHOLHDL", "LDLDIRECT" in the last 72 hours. Thyroid Function Tests: No results for input(s): "TSH", "T4TOTAL", "FREET4", "T3FREE", "THYROIDAB" in the last 72 hours. Anemia Panel: No results for input(s): "VITAMINB12", "FOLATE", "FERRITIN", "TIBC", "IRON", "RETICCTPCT" in the last 72 hours. Urine analysis: No results found for: "COLORURINE", "APPEARANCEUR", "LABSPEC", "PHURINE", "GLUCOSEU", "HGBUR", "BILIRUBINUR", "KETONESUR", "PROTEINUR", "UROBILINOGEN", "NITRITE", "LEUKOCYTESUR"  Radiological Exams on Admission: CT ANGIO LOW EXTREM RIGHT W &/OR WO CONTRAST  Result Date: 12/29/2021 CLINICAL DATA:  Claudication of right leg. EXAM: CT ANGIOGRAPHY LOWER RIGHT EXTREMITY TECHNIQUE: Multi detector CT imaging of the right lower extremity was performed using the standard protocol during bolus administration of intravenous contrast. Multiplanar CT image reconstruction and MIPS were obtained to evaluate the vascular  anatomy. CONTRAST:  OMNIPAQUE IOHEXOL 350 MG/ML SOLN COMPARISON:  None Available. FINDINGS: There is no evidence of an acute fracture, dislocation or joint effusion. Right common femoral artery: Free of significant atherosclerosis. The right common femoral bifurcates into the superficial femoral and deep (profundus) femoral arteries. Right Profundus femoris: Free of significant atherosclerosis. Right Superficial femoral: Free of significant atherosclerosis. Right Popliteal artery: Free of significant atherosclerosis. The popliteal artery bifurcates into the anterior tibial and tibioperoneal trunk. Right tibioperoneal trunk: Free of significant atherosclerosis. Right anterior tibial artery: Segments of distal hemodynamically significant stenosis and arterial occlusion are noted (axial CT images 329 through 481, CT series 5). Right posterior tibial artery: Free of significant atherosclerosis, continuous to the ankle. Right peroneal artery: Free of significant atherosclerosis, continuous to the ankle. Review of the MIP images confirms the above findings. IMPRESSION: 1. 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 Further evaluation with conventional angiography is recommended. Electronically Signed   By: Aram Candela M.D.   On: 12/29/2021 20:46   US Venous Img Lower Unilateral Right  Result Date: 12/29/2021 CLINICAL DATA:  Right calf pain. EXAM: RIGHT LOWER EXTREMITY VENOUS DOPPLER ULTRASOUND TECHNIQUE: Gray-scale sonography with compression, as well as color and duplex ultrasound, were performed to evaluate the deep venous system(s) from the level of the common femoral vein through the popliteal and proximal calf veins. COMPARISON:  None Available. FINDINGS: VENOUS Normal compressibility of the common femoral, superficial femoral, and popliteal veins, as well as the visualized calf veins. Visualized portions of profunda femoral vein and great  saphenous vein unremarkable. No filling defects to suggest DVT on grayscale or color Doppler imaging. Doppler waveforms show normal direction of venous flow, normal respiratory plasticity and response to augmentation. Limited views of the contralateral common femoral vein are unremarkable. OTHER None. Limitations: none IMPRESSION: Negative. Electronically Signed   By: Beckie Salts M.D.   On: 12/29/2021 14:59    EKG: Independently reviewed.  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

## 2021-12-29 NOTE — ED Provider Notes (Signed)
[facility] Provider Note    Event Date/Time   First MD Initiated Contact with Patient 12/29/21 1723     (approximate)   History   Chief Complaint: Leg Pain   HPI  Erika Roman is a 81 y.o. female with no known past medical history who complains of acute right lower leg pain for the past 3 days, severe.  No injuries.  Pain started while she was asleep, waking her up at 3:00 in the morning.  It has been constant, worse with walking.  No fever, no chest pain or shortness of breath.  She also notes that for the past month she has had pain with ambulation in the right leg.     Physical Exam   Triage Vital Signs: ED Triage Vitals  Enc Vitals Group     BP 12/29/21 1257 (!) 173/85     Pulse Rate 12/29/21 1257 79     Resp 12/29/21 1257 20     Temp 12/29/21 1257 98.3 F (36.8 C)     Temp Source 12/29/21 1257 Oral     SpO2 12/29/21 1257 96 %     Weight 12/29/21 1257 125 lb (56.7 kg)     Height 12/29/21 1257 5\' 3"  (1.6 m)     Head Circumference --      Peak Flow --      Pain Score 12/29/21 1256 10     Pain Loc --      Pain Edu? --      Excl. in GC? --     Most recent vital signs: Vitals:   12/29/21 2300 12/30/21 0000  BP: (!) 168/70 (!) 158/77  Pulse: (!) 55 63  Resp: 16 16  Temp:    SpO2: 97% 96%    General: Awake, no distress.  CV:  Good peripheral perfusion.  Regular rate and rhythm.  Right dorsalis pedis and posterior tibialis pulses are absent.  Right popliteal pulses absent on palpation.  Left-sided pulses are normal.  Doppler exam reveals absent dorsalis pedis flow.  There is identifiable PT pulsatile flow.  Popliteal flow identified on Doppler as well. Resp:  Normal effort.  Clear to auscultation bilaterally Abd:  No distention.  Soft and nontender Other:  No calf tenderness.  Symmetric calf circumference.  No pallor or coldness of the distal right leg.   ED Results / Procedures / Treatments   Labs (all labs ordered are listed,  but only abnormal results are displayed) Labs Reviewed  CBC WITH DIFFERENTIAL/PLATELET  PROTIME-INR  BASIC METABOLIC PANEL  APTT  HEPARIN LEVEL (UNFRACTIONATED)  CBC     EKG Interpreted by me Sinus rhythm rate of 59, normal axis and intervals.  Normal QRS ST segments and T waves.   RADIOLOGY Ultrasound right lower extremity interpreted by me, negative for DVT.  CT angiogram right lower extremity positive for occlusion of anterior tibial artery.  Discussed with radiologist   PROCEDURES:  .Critical Care  Performed by: 01/01/22, MD Authorized by: Sharman Cheek, MD   Critical care provider statement:    Critical care time (minutes):  35   Critical care time was exclusive of:  Separately billable procedures and treating other patients   Critical care was necessary to treat or prevent imminent or life-threatening deterioration of the following conditions:  Circulatory failure   Critical care was time spent personally by me on the following activities:  Development of treatment plan with patient or surrogate, discussions with consultants, evaluation of patient's  response to treatment, examination of patient, obtaining history from patient or surrogate, ordering and performing treatments and interventions, ordering and review of laboratory studies, ordering and review of radiographic studies, pulse oximetry, re-evaluation of patient's condition and review of old charts   Care discussed with: admitting provider      MEDICATIONS ORDERED IN ED: Medications  heparin ADULT infusion 100 units/mL (25000 units/255mL) (1,000 Units/hr Intravenous New Bag/Given 12/29/21 2147)  morphine (PF) 2 MG/ML injection 2 mg (2 mg Intravenous Given 12/29/21 1847)  iohexol (OMNIPAQUE) 350 MG/ML injection 100 mL (100 mLs Intravenous Contrast Given 12/29/21 1936)  heparin bolus via infusion 3,500 Units (3,500 Units Intravenous Bolus from Bag 12/29/21 2147)     IMPRESSION / MDM / ASSESSMENT AND  PLAN / ED COURSE  I reviewed the triage vital signs and the nursing notes.                              Differential diagnosis includes, but is not limited to, arterial occlusion, muscle spasm, occult fracture, DVT.  Doubt infection.  Patient's presentation is most consistent with acute presentation with potential threat to life or bodily function.  Patient presents with symptoms of claudication in the right lower extremity and diminished pulses.  Vital signs unremarkable.  Ultrasound negative for DVT.  CT angiogram positive for anterior tibial occlusion.  Discussed with vascular surgery who recommend hospitalization for endovascular angiography tomorrow.  We will start heparin       FINAL CLINICAL IMPRESSION(S) / ED DIAGNOSES   Final diagnoses:  Pain of right lower extremity due to ischemia     Rx / DC Orders   ED Discharge Orders     None        Note:  This document was prepared using Dragon voice recognition software and may include unintentional dictation errors.   Sharman Cheek, MD 12/30/21 [phone]

## 2021-12-29 NOTE — ED Notes (Signed)
Pt to CT via cart with CT tech.

## 2021-12-30 DIAGNOSIS — I771 Stricture of artery: Secondary | ICD-10-CM | POA: Diagnosis not present

## 2021-12-30 DIAGNOSIS — I998 Other disorder of circulatory system: Secondary | ICD-10-CM | POA: Diagnosis not present

## 2021-12-30 DIAGNOSIS — E785 Hyperlipidemia, unspecified: Secondary | ICD-10-CM | POA: Diagnosis not present

## 2021-12-30 DIAGNOSIS — I739 Peripheral vascular disease, unspecified: Secondary | ICD-10-CM | POA: Diagnosis not present

## 2021-12-30 DIAGNOSIS — M79671 Pain in right foot: Secondary | ICD-10-CM

## 2021-12-30 DIAGNOSIS — M79606 Pain in leg, unspecified: Secondary | ICD-10-CM | POA: Diagnosis present

## 2021-12-30 DIAGNOSIS — M79661 Pain in right lower leg: Secondary | ICD-10-CM | POA: Diagnosis not present

## 2021-12-30 LAB — COMPREHENSIVE METABOLIC PANEL
ALT: 11 U/L (ref 0–44)
AST: 20 U/L (ref 15–41)
Albumin: 4.1 g/dL (ref 3.5–5.0)
Alkaline Phosphatase: 79 U/L (ref 38–126)
Anion gap: 11 (ref 5–15)
BUN: 12 mg/dL (ref 8–23)
CO2: 28 mmol/L (ref 22–32)
Calcium: 9.2 mg/dL (ref 8.9–10.3)
Chloride: 103 mmol/L (ref 98–111)
Creatinine, Ser: 0.61 mg/dL (ref 0.44–1.00)
GFR, Estimated: >60 mL/min (ref >60)
Glucose, Bld: 102 mg/dL — ABNORMAL HIGH (ref 70–99)
Potassium: 3.6 mmol/L (ref 3.5–5.1)
Sodium: 142 mmol/L (ref 135–145)
Total Bilirubin: 1.6 mg/dL — ABNORMAL HIGH (ref 0.3–1.2)
Total Protein: 7.7 g/dL (ref 6.5–8.1)

## 2021-12-30 LAB — CBC
HCT: 37.3 % (ref 36.0–46.0)
Hemoglobin: 12.2 g/dL (ref 12.0–15.0)
MCH: 30.5 pg (ref 26.0–34.0)
MCHC: 32.7 g/dL (ref 30.0–36.0)
MCV: 93.3 fL (ref 80.0–100.0)
Platelets: 320 10*3/uL (ref 150–400)
RBC: 4 MIL/uL (ref 3.87–5.11)
RDW: 12.6 % (ref 11.5–15.5)
WBC: 6.7 10*3/uL (ref 4.0–10.5)
nRBC: 0 % (ref 0.0–0.2)

## 2021-12-30 LAB — HEMOGLOBIN A1C
Hgb A1c MFr Bld: 5.2 % (ref 4.8–5.6)
Mean Plasma Glucose: 102.54 mg/dL

## 2021-12-30 LAB — LIPID PANEL
Cholesterol: 226 mg/dL — ABNORMAL HIGH (ref 0–200)
HDL: 72 mg/dL (ref >40)
LDL Cholesterol: 143 mg/dL — ABNORMAL HIGH (ref 0–99)
Total CHOL/HDL Ratio: 3.1 RATIO
Triglycerides: 55 mg/dL (ref <150)
VLDL: 11 mg/dL (ref 0–40)

## 2021-12-30 LAB — TSH: TSH: 3.161 u[IU]/mL (ref 0.350–4.500)

## 2021-12-30 LAB — HEPARIN LEVEL (UNFRACTIONATED): Heparin Unfractionated: 1 IU/mL — ABNORMAL HIGH (ref 0.30–0.70)

## 2021-12-30 MED ORDER — ATORVASTATIN CALCIUM 20 MG PO TABS: 40.0000 mg | ORAL_TABLET | Freq: Every evening | ORAL | Status: DC

## 2021-12-30 MED ORDER — ONDANSETRON HCL 4 MG/2ML IJ SOLN: 4.0000 mg | Freq: Four times a day (QID) | INTRAMUSCULAR | Status: DC | PRN

## 2021-12-30 MED ORDER — OXYCODONE HCL 5 MG PO TABS: 5.0000 mg | ORAL_TABLET | ORAL | Status: DC | PRN

## 2021-12-30 MED ORDER — HEPARIN (PORCINE) 25000 UT/250ML-% IV SOLN: 850.0000 [IU]/h | INTRAVENOUS | Status: DC

## 2021-12-30 MED ORDER — ONDANSETRON HCL 4 MG PO TABS: 4.0000 mg | ORAL_TABLET | Freq: Four times a day (QID) | ORAL | Status: DC | PRN

## 2021-12-30 MED ORDER — TRAMADOL HCL 50 MG PO TABS
50.0000 mg | ORAL_TABLET | Freq: Four times a day (QID) | ORAL | Status: DC | PRN
  Administered 2021-12-30 – 2021-12-31 (×2): 50 mg via ORAL
  Filled 2021-12-30 (×2): qty 1

## 2021-12-30 MED ORDER — ALBUTEROL SULFATE (2.5 MG/3ML) 0.083% IN NEBU: 2.5000 mg | INHALATION_SOLUTION | RESPIRATORY_TRACT | Status: DC | PRN

## 2021-12-30 MED ORDER — FENTANYL CITRATE PF 50 MCG/ML IJ SOSY: 12.5000 ug | PREFILLED_SYRINGE | INTRAMUSCULAR | Status: DC | PRN

## 2021-12-30 MED ORDER — SODIUM CHLORIDE 0.9 % IV SOLN: INTRAVENOUS | Status: DC

## 2021-12-30 NOTE — Progress Notes (Signed)
ANTICOAGULATION CONSULT NOTE - Initial Consult  Pharmacy Consult for heparin infusion Indication:  ischemic limb  No Known Allergies  Patient Measurements: Height: 5\' 3"  (160 cm) Weight: 56.7 kg (125 lb) IBW/kg (Calculated) : 52.4 Heparin Dosing Weight: 56.7 kg  Vital Signs: Temp: 97.1 F (36.2 C) (08/28 0600) Temp Source: Oral (08/28 0600) BP: 140/51 (08/28 0600) Pulse Rate: 59 (08/28 0600)  Labs: Recent Labs    12/29/21 1845 12/30/21 0415  HGB 12.5 12.2  HCT 38.8 37.3  PLT 319 320  APTT 32  --   LABPROT 13.5  --   INR 1.0  --   CREATININE 0.59 0.61    Estimated Creatinine Clearance: 45.6 mL/min (by C-G formula based on SCr of 0.61 mg/dL).   Medical History: History reviewed. No pertinent past medical history.  Medications:  Infusions:   sodium chloride 75 mL/hr at 12/30/21 0417   heparin 1,000 Units/hr (12/29/21 2147)   PRN: albuterol, fentaNYL (SUBLIMAZE) injection, ondansetron **OR** ondansetron (ZOFRAN) IV, oxyCODONE, traMADol    Assessment: Erika Roman is a 81 year old female presenting with right leg pain. No pertinent PMH. Not on PTA anticoagulation per chart review. 12/29/2021 12/31/2021 of RLE negative. 12/29/2021 CTA RLE showed significant stenosis and arterial occlusion of the distal right anterior tibial artery. Starting heparin for ischemic limb.   Baseline Labs: aPTT 32, PT 13.5, INR 1.0, Hgb 12.5, Hct 38.8, Plt 319   Date Time HL Rate/Comment   8/28 0758 1.0 SUPRAtherapeutic  Goal of Therapy:  Heparin level 0.3-0.7 units/ml Monitor platelets by anticoagulation protocol: Yes   Plan:  Hold heparin infusion x 1 hour then resume heparin infusion at 850 units/hr Check anti-Xa level in 8 hours and daily while on heparin Continue to monitor H&H and platelets  9/28, PharmD PGY1 Pharmacy Resident 12/30/2021 8:11 AM

## 2021-12-30 NOTE — Progress Notes (Signed)
PROGRESS NOTE    DIANELY KREHBIEL  ZOX:[id number] DOB: [birth year] DOA: 12/29/2021 PCP: [facility]    Brief Narrative:  81 year old female with no significant medical history presented with right lower extremity pain for 3 days and worse symptom for about 24 hours.  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   Assessment & Plan:   Acute leg pain, subacute to chronic ischemia and claudication. Admitted with significant symptoms Currently remains on a heparin, followed by vascular surgery. Vascular surgery assessed patient not having life-threatening ischemia, definitive surgical plans pending. Mobilize. Continue to monitor for peripheral vasculature.  Hyperlipidemia: LDL is 143.  Patient with peripheral vascular disease, will start patient on high intensity statin.    DVT prophylaxis:   Heparin infusion   Code Status: Full code Family Communication: None Disposition Plan: Status is: Inpatient Remains inpatient appropriate because: Arterial occlusion, anticipate inpatient procedure     Consultants:  Vascular surgery  Procedures:  None  Antimicrobials:  None   Subjective: Patient was seen and examined.  Still in the emergency room.  Mobilizing around.  Occasional pain but not like that brought her to the ER.  Remains on heparin infusion.  Was hungry in the morning, case was discussed with surgery and she was given regular diet.  Objective: Vitals:   12/30/21 0855 12/30/21 1000 12/30/21 1300 12/30/21 1400  BP: (!) 149/63 (!) 144/77  (!) 167/93  Pulse: 61 88  78  Resp: 16   18  Temp: 98.2 F (36.8 C)  98.2 F (36.8 C)   TempSrc: Oral  Oral   SpO2: 97% 98%  95%  Weight:      Height:       No intake or output data in the 24 hours ending  12/30/21 1417 Filed Weights   12/29/21 1257  Weight: 56.7 kg    Examination:  General exam: Appears calm and comfortable  Respiratory system: Clear to auscultation. Respiratory effort normal. Cardiovascular system: S1 & S2 heard, RRR. No JVD, murmurs, rubs, gallops or clicks. No pedal edema. Gastrointestinal system: Abdomen is nondistended, soft and nontender. No organomegaly or masses felt. Normal bowel sounds heard. Central nervous system: Alert and oriented. No focal neurological deficits. Extremities: Symmetric 5 x 5 power. Skin: Patient has palpable pulses bilaterally.     Data Reviewed: I have personally reviewed following labs and imaging studies  CBC: Recent Labs  Lab 12/29/21 1845 12/30/21 0415  WBC 5.0 6.7  NEUTROABS 2.7  --   HGB 12.5 12.2  HCT 38.8 37.3  MCV 93.0 93.3  PLT 319 320   Basic Metabolic Panel: Recent Labs  Lab 12/29/21 1845 12/30/21 0415  NA 141 142  K 3.7 3.6  CL 104 103  CO2 27 28  GLUCOSE 99 102*  BUN 12 12  CREATININE 0.59 0.61  CALCIUM 9.5 9.2   GFR: Estimated Creatinine Clearance: 45.6 mL/min (by C-G formula based on SCr of 0.61 mg/dL). Liver Function Tests: Recent Labs  Lab 12/30/21 0415  AST 20  ALT 11  ALKPHOS 79  BILITOT 1.6*  PROT 7.7  ALBUMIN 4.1   No results for input(s): "LIPASE", "AMYLASE" in the last 168 hours. No results for input(s): "AMMONIA" in the last 168 hours. Coagulation Profile: Recent Labs  Lab 12/29/21 1845  INR 1.0   Cardiac Enzymes: No results for input(s): "  CKTOTAL", "CKMB", "CKMBINDEX", "TROPONINI" in the last 168 hours. BNP (last 3 results) No results for input(s): "PROBNP" in the last 8760 hours. HbA1C: Recent Labs    12/29/21 1845  HGBA1C 5.2   CBG: No results for input(s): "GLUCAP" in the last 168 hours. Lipid Profile: Recent Labs    12/30/21 0415  CHOL 226*  HDL 72  LDLCALC 143*  TRIG 55  CHOLHDL 3.1   Thyroid Function Tests: Recent Labs    12/30/21 0415  TSH  3.161   Anemia Panel: No results for input(s): "VITAMINB12", "FOLATE", "FERRITIN", "TIBC", "IRON", "RETICCTPCT" in the last 72 hours. Sepsis Labs: No results for input(s): "PROCALCITON", "LATICACIDVEN" in the last 168 hours.  No results found for this or any previous visit (from the past 240 hour(s)).       Radiology Studies: CT ANGIO LOW EXTREM RIGHT W &/OR WO CONTRAST  Result Date: 12/29/2021 CLINICAL DATA:  Claudication of right leg. EXAM: CT ANGIOGRAPHY LOWER RIGHT EXTREMITY TECHNIQUE: Multi detector CT imaging of the right lower extremity was performed using the standard protocol during bolus administration of intravenous contrast. Multiplanar CT image reconstruction and MIPS were obtained to evaluate the vascular anatomy. CONTRAST:  OMNIPAQUE IOHEXOL 350 MG/ML SOLN COMPARISON:  None Available. FINDINGS: There is no evidence of an acute fracture, dislocation or joint effusion. Right common femoral artery: Free of significant atherosclerosis. The right common femoral bifurcates into the superficial femoral and deep (profundus) femoral arteries. Right Profundus femoris: Free of significant atherosclerosis. Right Superficial femoral: Free of significant atherosclerosis. Right Popliteal artery: Free of significant atherosclerosis. The popliteal artery bifurcates into the anterior tibial and tibioperoneal trunk. Right tibioperoneal trunk: Free of significant atherosclerosis. Right anterior tibial artery: Segments of distal hemodynamically significant stenosis and arterial occlusion are noted (axial CT images 329 through 481, CT series 5). Right posterior tibial artery: Free of significant atherosclerosis, continuous to the ankle. Right peroneal artery: Free of significant atherosclerosis, continuous to the ankle. Review of the MIP images confirms the above findings. IMPRESSION: 1. 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 Further  evaluation with conventional angiography is recommended. Electronically Signed   By: Aram Candela M.D.   On: 12/29/2021 20:46   US Venous Img Lower Unilateral Right  Result Date: 12/29/2021 CLINICAL DATA:  Right calf pain. EXAM: RIGHT LOWER EXTREMITY VENOUS DOPPLER ULTRASOUND TECHNIQUE: Gray-scale sonography with compression, as well as color and duplex ultrasound, were performed to evaluate the deep venous system(s) from the level of the common femoral vein through the popliteal and proximal calf veins. COMPARISON:  None Available. FINDINGS: VENOUS Normal compressibility of the common femoral, superficial femoral, and popliteal veins, as well as the visualized calf veins. Visualized portions of profunda femoral vein and great saphenous vein unremarkable. No filling defects to suggest DVT on grayscale or color Doppler imaging. Doppler waveforms show normal direction of venous flow, normal respiratory plasticity and response to augmentation. Limited views of the contralateral common femoral vein are unremarkable. OTHER None. Limitations: none IMPRESSION: Negative. Electronically Signed   By: Beckie Salts M.D.   On: 12/29/2021 14:59        Scheduled Meds:  atorvastatin  40 mg Oral Daily   Continuous Infusions:  heparin 850 Units/hr (12/30/21 1037)     LOS: 0 days    Time spent: 35 minutes    Dorcas Carrow, MD [facility] Pager [phone]

## 2021-12-30 NOTE — Progress Notes (Signed)
[facility]  Daily Progress Note   Subjective  -   Foot hurting less today.  Eating dinner.  Objective Vitals:   12/30/21 0855 12/30/21 1000 12/30/21 1300 12/30/21 1400  BP: (!) 149/63 (!) 144/77  (!) 167/93  Pulse: 61 88  78  Resp: 16   18  Temp: 98.2 F (36.8 C)  98.2 F (36.8 C)   TempSrc: Oral  Oral   SpO2: 97% 98%  95%  Weight:      Height:       No intake or output data in the 24 hours ending 12/30/21 1622  PULM  CTAB CV  RRR VASC  foot is warm with good capillary refill  Laboratory CBC    Component Value Date/Time   WBC 6.7 12/30/2021 0415   HGB 12.2 12/30/2021 0415   HCT 37.3 12/30/2021 0415   PLT 320 12/30/2021 0415    BMET    Component Value Date/Time   NA 142 12/30/2021 0415   K 3.6 12/30/2021 0415   CL 103 12/30/2021 0415   CO2 28 12/30/2021 0415   GLUCOSE 102 (H) 12/30/2021 0415   BUN 12 12/30/2021 0415   CREATININE 0.61 12/30/2021 0415   CALCIUM 9.2 12/30/2021 0415   GFRNONAA >60 12/30/2021 0415    Assessment/Planning:   Right foot pain.  Not a vascular origin.  CTA reviewed and she has two-vessel runoff into the foot and likely 3.  CTA is notoriously poor for tibial evaluation but the fact that she has 2 vessels going into the foot and the third the gets almost to the foot would indicate essentially normal blood flow. Discussed with the patient her blood flow is better than average for most folks in their 13Y and I certainly would not recommend any intervention. Can discontinue the heparin from my point of view No further vascular work-up planned   Festus Barren  12/30/2021, 4:22 PM

## 2021-12-30 NOTE — ED Notes (Signed)
Pt assisted to restroom. Pt ambulatory independent after set up. Pt back in bed at this time, warm blankets given, no additional needs or concerns

## 2021-12-30 NOTE — Plan of Care (Signed)

## 2021-12-31 DIAGNOSIS — I998 Other disorder of circulatory system: Secondary | ICD-10-CM | POA: Diagnosis not present

## 2021-12-31 LAB — GLUCOSE, CAPILLARY: Glucose-Capillary: 94 mg/dL (ref 70–99)

## 2021-12-31 MED ORDER — ACETAMINOPHEN 500 MG PO TABS: 500.0000 mg | ORAL_TABLET | Freq: Four times a day (QID) | ORAL | 0 refills | Status: AC | PRN

## 2021-12-31 MED ORDER — ATORVASTATIN CALCIUM 40 MG PO TABS: 40.0000 mg | ORAL_TABLET | Freq: Every evening | ORAL | 2 refills | Status: AC

## 2021-12-31 NOTE — Progress Notes (Signed)
AVS given and reviewed with pt. Medications discussed. All questions answered to satisfaction. Pt verbalized understanding of information given. Pt escorted off the unit with all belongings via wheelchair by volunteer services.  

## 2021-12-31 NOTE — Discharge Summary (Signed)
Physician Discharge Summary  Erika Roman YQM:[id number] DOB: 14-Sep-1940 DOA: 12/29/2021  PCP: [facility]  Admit date: 12/29/2021 Discharge date: 12/31/2021  Admitted From: Home Disposition: Home  Recommendations for Outpatient Follow-up:  Follow up with PCP in 1-2 weeks after discharge   Home Health: N/A Equipment/Devices: N/A  Discharge Condition: Stable  CODE STATUS: Full code Diet recommendation: Regular diet  Discharge summary: Very active 81 year old female with no significant medical history presented with right lower extremity pain for 3 days and worse symptom for about 24 hours.  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  She was admitted for observation and monitoring.  Seen and followed by vascular surgery.  Patient did not have any arterial compromise.  The right foot pain was evaluated and assessed to be not related to vascular origin.  CT angiogram with two-vessel runoff into the foot.  Symptoms improved.  Blood flow is adequate with palpable distal pulses.  Advised monitoring for symptoms at home by vascular surgery.  No intervention planned. Patient was found with LDL of 143 and she is agreed to go on a statin.  Will discharge on atorvastatin.      Discharge Diagnoses:  Principal Problem:   Lower limb ischemia Active Problems:   Acute leg pain    Discharge Instructions  Discharge Instructions     Call MD for:  severe uncontrolled pain   Complete by: As directed    Diet general   Complete by: As directed    Increase activity slowly   Complete by: As directed       Allergies as of 12/31/2021   No Known Allergies      Medication List     TAKE these medications    acetaminophen 500 MG tablet Commonly known as:  TYLENOL Take 1 tablet (500 mg total) by mouth every 6 (six) hours as needed for mild pain or fever.   atorvastatin 40 MG tablet Commonly known as: LIPITOR Take 1 tablet (40 mg total) by mouth every evening.   traMADol 50 MG tablet Commonly known as: ULTRAM Take 50 mg by mouth every 6 (six) hours as needed.        No Known Allergies  Consultations: Vascular surgery   Procedures/Studies: CT ANGIO LOW EXTREM RIGHT W &/OR WO CONTRAST  Result Date: 12/29/2021 CLINICAL DATA:  Claudication of right leg. EXAM: CT ANGIOGRAPHY LOWER RIGHT EXTREMITY TECHNIQUE: Multi detector CT imaging of the right lower extremity was performed using the standard protocol during bolus administration of intravenous contrast. Multiplanar CT image reconstruction and MIPS were obtained to evaluate the vascular anatomy. CONTRAST:  OMNIPAQUE IOHEXOL 350 MG/ML SOLN COMPARISON:  None Available. FINDINGS: There is no evidence of an acute fracture, dislocation or joint effusion. Right common femoral artery: Free of significant atherosclerosis. The right common femoral bifurcates into the superficial femoral and deep (profundus) femoral arteries. Right Profundus femoris: Free of significant atherosclerosis. Right Superficial femoral: Free of significant atherosclerosis. Right Popliteal artery: Free of significant atherosclerosis. The popliteal artery bifurcates into the anterior tibial and tibioperoneal trunk. Right tibioperoneal trunk: Free of significant atherosclerosis. Right anterior tibial artery: Segments of distal hemodynamically significant stenosis and arterial occlusion are noted (axial CT images 329 through 481, CT series 5). Right posterior tibial artery: Free of significant atherosclerosis, continuous to the ankle. Right peroneal artery: Free of significant  atherosclerosis, continuous to the ankle. Review of the MIP images confirms the above findings. IMPRESSION: 1. 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 Further evaluation with conventional angiography is recommended. Electronically Signed   By: Aram Candela M.D.   On: 12/29/2021 20:46   US Venous Img Lower Unilateral Right  Result Date: 12/29/2021 CLINICAL DATA:  Right calf pain. EXAM: RIGHT LOWER EXTREMITY VENOUS DOPPLER ULTRASOUND TECHNIQUE: Gray-scale sonography with compression, as well as color and duplex ultrasound, were performed to evaluate the deep venous system(s) from the level of the common femoral vein through the popliteal and proximal calf veins. COMPARISON:  None Available. FINDINGS: VENOUS Normal compressibility of the common femoral, superficial femoral, and popliteal veins, as well as the visualized calf veins. Visualized portions of profunda femoral vein and great saphenous vein unremarkable. No filling defects to suggest DVT on grayscale or color Doppler imaging. Doppler waveforms show normal direction of venous flow, normal respiratory plasticity and response to augmentation. Limited views of the contralateral common femoral vein are unremarkable. OTHER None. Limitations: none IMPRESSION: Negative. Electronically Signed   By: Beckie Salts M.D.   On: 12/29/2021 14:59   (Echo, Carotid, EGD, Colonoscopy, ERCP)    Subjective: Patient seen in the morning rounds.  Denies any complaints.  Eager to go home.  Denies any pain since yesterday.  She is agreeable to use some Tylenol for some pain.   Discharge Exam: Vitals:   12/30/21 2000 12/31/21 0336  BP: (!) 151/74 (!) 125/56  Pulse: 73 66  Resp: 18 20  Temp: 98.9 F (37.2 C) 98.5 F (36.9 C)  SpO2: 98% 97%   Vitals:   12/30/21 1700 12/30/21 1807 12/30/21 2000 12/31/21 0336  BP: (!) 160/72 (!) 145/66 (!) 151/74 (!) 125/56  Pulse: 80 70 73 66  Resp: 18 17 18 20   Temp: 98 F (36.7 C) 98.8 F (37.1 C) 98.9 F (37.2 C) 98.5 F (36.9 C)  TempSrc: Oral  Oral Oral  SpO2: 98% 99% 98% 97%  Weight:      Height:         General: Pt is alert, awake, not in acute distress Cardiovascular: RRR, S1/S2 +, no rubs, no gallops Respiratory: CTA bilaterally, no wheezing, no rhonchi Abdominal: Soft, NT, ND, bowel sounds + Extremities: no edema, no cyanosis No change in color of the foot, palpable dorsalis pedis and posterior tibialis pulses.    The results of significant diagnostics from this hospitalization (including imaging, microbiology, ancillary and laboratory) are listed below for reference.     Microbiology: No results found for this or any previous visit (from the past 240 hour(s)).   Labs: BNP (last 3 results) No results for input(s): "BNP" in the last 8760 hours. Basic Metabolic Panel: Recent Labs  Lab 12/29/21 1845 12/30/21 0415  NA 141 142  K 3.7 3.6  CL 104 103  CO2 27 28  GLUCOSE 99 102*  BUN 12 12  CREATININE 0.59 0.61  CALCIUM 9.5 9.2   Liver Function Tests: Recent Labs  Lab 12/30/21 0415  AST 20  ALT 11  ALKPHOS 79  BILITOT 1.6*  PROT 7.7  ALBUMIN 4.1   No results for input(s): "LIPASE", "AMYLASE" in the last 168 hours. No results for input(s): "AMMONIA" in the last 168 hours. CBC: Recent Labs  Lab 12/29/21 1845 12/30/21 0415  WBC 5.0 6.7  NEUTROABS 2.7  --   HGB 12.5 12.2  HCT 38.8 37.3  MCV 93.0 93.3  PLT 319 320   Cardiac Enzymes: No results for input(s): "CKTOTAL", "CKMB", "CKMBINDEX", "TROPONINI" in the last 168 hours. BNP: Invalid input(s): "POCBNP" CBG: Recent Labs  Lab 12/31/21 0815  GLUCAP 94   D-Dimer No results for input(s): "DDIMER" in the last 72 hours. Hgb A1c Recent Labs    12/29/21 1845  HGBA1C 5.2   Lipid Profile Recent Labs    12/30/21 0415  CHOL 226*  HDL 72  LDLCALC 143*  TRIG 55  CHOLHDL 3.1   Thyroid function studies Recent Labs    12/30/21 0415  TSH 3.161   Anemia work up No results for input(s): "VITAMINB12", "FOLATE", "FERRITIN", "TIBC", "IRON", "RETICCTPCT" in the last 72 hours. Urinalysis No  results found for: "COLORURINE", "APPEARANCEUR", "LABSPEC", "PHURINE", "GLUCOSEU", "HGBUR", "BILIRUBINUR", "KETONESUR", "PROTEINUR", "UROBILINOGEN", "NITRITE", "LEUKOCYTESUR" Sepsis Labs Recent Labs  Lab 12/29/21 1845 12/30/21 0415  WBC 5.0 6.7   Microbiology No results found for this or any previous visit (from the past 240 hour(s)).   Time coordinating discharge: 28 minutes   SIGNED:   Dorcas Carrow, MD  [facility] 12/31/2021, 9:11 AM

## 2021-12-31 NOTE — TOC CM/SW Note (Signed)
Patient has orders to discharge home today. Chart reviewed. PCP is [facility]. On room air. No wounds. No TOC needs identified. CSW signing off. ? ?Antuane Eastridge, CSW ?[phone] ? ?

## 2022-01-01 ENCOUNTER — Emergency Department
Admission: EM | Admit: 2022-01-01 | Discharge: 2022-01-01 | Disposition: A | Discharge status: Home / Self Care | Payer: Medicare HMO | Attending: Emergency Medicine | Admitting: Emergency Medicine

## 2022-01-01 DIAGNOSIS — R0902 Hypoxemia: Secondary | ICD-10-CM | POA: Diagnosis not present

## 2022-01-01 DIAGNOSIS — R112 Nausea with vomiting, unspecified: Secondary | ICD-10-CM | POA: Diagnosis not present

## 2022-01-01 DIAGNOSIS — I10 Essential (primary) hypertension: Secondary | ICD-10-CM | POA: Diagnosis not present

## 2022-01-01 DIAGNOSIS — R000 Tachycardia, unspecified: Secondary | ICD-10-CM | POA: Diagnosis not present

## 2022-01-01 LAB — URINALYSIS, ROUTINE W REFLEX MICROSCOPIC
Bilirubin Urine: NEGATIVE
Glucose, UA: 50 mg/dL — AB
Hgb urine dipstick: NEGATIVE
Ketones, ur: NEGATIVE mg/dL
Leukocytes,Ua: NEGATIVE
Nitrite: NEGATIVE
Protein, ur: 30 mg/dL — AB
Specific Gravity, Urine: 1.016 (ref 1.005–1.030)
pH: 6 (ref 5.0–8.0)

## 2022-01-01 LAB — COMPREHENSIVE METABOLIC PANEL
ALT: 17 U/L (ref 0–44)
AST: 39 U/L (ref 15–41)
Albumin: 4.1 g/dL (ref 3.5–5.0)
Alkaline Phosphatase: 79 U/L (ref 38–126)
Anion gap: 10 (ref 5–15)
BUN: 16 mg/dL (ref 8–23)
CO2: 28 mmol/L (ref 22–32)
Calcium: 9.5 mg/dL (ref 8.9–10.3)
Chloride: 103 mmol/L (ref 98–111)
Creatinine, Ser: 0.69 mg/dL (ref 0.44–1.00)
GFR, Estimated: >60 mL/min (ref >60)
Glucose, Bld: 196 mg/dL — ABNORMAL HIGH (ref 70–99)
Potassium: 3.5 mmol/L (ref 3.5–5.1)
Sodium: 141 mmol/L (ref 135–145)
Total Bilirubin: 1.2 mg/dL (ref 0.3–1.2)
Total Protein: 7.7 g/dL (ref 6.5–8.1)

## 2022-01-01 LAB — CBC WITH DIFFERENTIAL/PLATELET
Abs Immature Granulocytes: 0.04 10*3/uL (ref 0.00–0.07)
Basophils Absolute: 0 10*3/uL (ref 0.0–0.1)
Basophils Relative: 0 %
Eosinophils Absolute: 0 10*3/uL (ref 0.0–0.5)
Eosinophils Relative: 0 %
HCT: 35.6 % — ABNORMAL LOW (ref 36.0–46.0)
Hemoglobin: 11.6 g/dL — ABNORMAL LOW (ref 12.0–15.0)
Immature Granulocytes: 1 %
Lymphocytes Relative: 7 %
Lymphs Abs: 0.5 10*3/uL — ABNORMAL LOW (ref 0.7–4.0)
MCH: 30.5 pg (ref 26.0–34.0)
MCHC: 32.6 g/dL (ref 30.0–36.0)
MCV: 93.7 fL (ref 80.0–100.0)
Monocytes Absolute: 0.2 10*3/uL (ref 0.1–1.0)
Monocytes Relative: 3 %
Neutro Abs: 5.7 10*3/uL (ref 1.7–7.7)
Neutrophils Relative %: 89 %
Platelets: 286 10*3/uL (ref 150–400)
RBC: 3.8 MIL/uL — ABNORMAL LOW (ref 3.87–5.11)
RDW: 12.6 % (ref 11.5–15.5)
WBC: 6.4 10*3/uL (ref 4.0–10.5)
nRBC: 0 % (ref 0.0–0.2)

## 2022-01-01 LAB — LIPASE, BLOOD: Lipase: 28 U/L (ref 11–51)

## 2022-01-01 MED ORDER — ONDANSETRON 4 MG PO TBDP: 4.0000 mg | ORAL_TABLET | Freq: Once | ORAL | Status: DC

## 2022-01-01 MED ORDER — ONDANSETRON 4 MG PO TBDP: 4.0000 mg | ORAL_TABLET | Freq: Three times a day (TID) | ORAL | 0 refills | Status: DC | PRN

## 2022-01-01 NOTE — ED Provider Notes (Signed)
[facility] Provider Note    Event Date/Time   First MD Initiated Contact with Patient 01/01/22 1818     (approximate)   History   Vomiting   HPI  Erika Roman is a 81 y.o. female who was recently discharged with tramadol for leg pain for an arterial occlusion.  Evaluation showed that there actually was not an arterial occlusion and she was sent home with tramadol.  Patient reports her leg is a lot better now and she is not needing as much tramadol.  She was taking the tramadol every 6 hours earlier today and she began vomiting.  She does not have the tramadol for quite some time today and is no longer nauseated.  She has no belly pain.      Physical Exam   Triage Vital Signs: ED Triage Vitals  Enc Vitals Group     BP 01/01/22 1506 (!) 148/64     Pulse Rate 01/01/22 1506 85     Resp 01/01/22 1506 17     Temp 01/01/22 1506 98.2 F (36.8 C)     Temp Source 01/01/22 1506 Oral     SpO2 01/01/22 1506 100 %     Weight 01/01/22 1510 125 lb (56.7 kg)     Height 01/01/22 1510 5\' 3"  (1.6 m)     Head Circumference --      Peak Flow --      Pain Score 01/01/22 1510 0     Pain Loc --      Pain Edu? --      Excl. in GC? --     Most recent vital signs: Vitals:   01/01/22 1506  BP: (!) 148/64  Pulse: 85  Resp: 17  Temp: 98.2 F (36.8 C)  SpO2: 100%     General: Awake, no distress.  CV:  Good peripheral perfusion.  Heart regular rate and rhythm no audible murmurs Resp:  Normal effort.  Lungs are clear Abd:  No distention.  Soft and nontender no organomegaly Extremities with no swelling   ED Results / Procedures / Treatments   Labs (all labs ordered are listed, but only abnormal results are displayed) Labs Reviewed  CBC WITH DIFFERENTIAL/PLATELET - Abnormal; Notable for the following components:      Result Value   RBC 3.80 (*)    Hemoglobin 11.6 (*)    HCT 35.6 (*)    Lymphs Abs 0.5 (*)    All other components within normal limits   COMPREHENSIVE METABOLIC PANEL - Abnormal; Notable for the following components:   Glucose, Bld 196 (*)    All other components within normal limits  URINALYSIS, ROUTINE W REFLEX MICROSCOPIC - Abnormal; Notable for the following components:   Color, Urine YELLOW (*)    APPearance HAZY (*)    Glucose, UA 50 (*)    Protein, ur 30 (*)    Bacteria, UA FEW (*)    All other components within normal limits  LIPASE, BLOOD     EKG     RADIOLOGY    PROCEDURES:  Critical Care performed:   Procedures   MEDICATIONS ORDERED IN ED: Medications  ondansetron (ZOFRAN-ODT) disintegrating tablet 4 mg (has no administration in time range)     IMPRESSION / MDM / ASSESSMENT AND PLAN / ED COURSE  I reviewed the triage vital signs and the nursing notes. I will try to see if the patient can eat or drink anything and if she can I will let  her go with the presumed diagnosis of nausea and vomiting from the tramadol.  Can try some Zofran to if need be.  I will let her go home with some Zofran and tell her not to use the tramadol she does not need it.  Differential diagnosis includes, but is not limited to, gastritis gastroenteritis pancreatitis intestinal obstruction are all possibilities although very unlikely.  The most likely possibility right now with normal lab work shows  Patient's presentation is most consistent with acute complicated illness / injury requiring diagnostic workup.  ----------------------------------------- 6:55 PM on 01/01/2022 ----------------------------------------- Patient doing well now eating well with no problems.  We will let her go.     FINAL CLINICAL IMPRESSION(S) / ED DIAGNOSES   Final diagnoses:  Nausea and vomiting, unspecified vomiting type     Rx / DC Orders   ED Discharge Orders          Ordered    ondansetron (ZOFRAN-ODT) 4 MG disintegrating tablet  Every 8 hours PRN        01/01/22 1855             Note:  This document was prepared  using Dragon voice recognition software and may include unintentional dictation errors.   Arnaldo Natal, MD 01/01/22 [phone]

## 2022-01-01 NOTE — ED Provider Triage Note (Signed)
  Emergency Medicine Provider Triage Evaluation Note  Erika Roman , a 81 y.o.female,  was evaluated in triage.  Pt complains of nausea/vomiting.  She states that she was recently discharged yesterday after having a right anterior tibial artery occlusion.  She was prescribed atorvastatin, tramadol, and acetaminophen.  She states that since taking these medications, she has persistent nausea/vomiting.  Denies abdominal pain.   Review of Systems  Positive: Nausea/vomiting Negative: Denies fever, chest pain, diarrhea  Physical Exam  There were no vitals filed for this visit. Gen:   Awake, no distress   Resp:  Normal effort  MSK:   Moves extremities without difficulty  Other:    Medical Decision Making  Given the patient's initial medical screening exam, the following diagnostic evaluation has been ordered. The patient will be placed in the appropriate treatment space, once one is available, to complete the evaluation and treatment. I have discussed the plan of care with the patient and I have advised the patient that an ED physician or mid-level practitioner will reevaluate their condition after the test results have been received, as the results may give them additional insight into the type of treatment they may need.    Diagnostics: Labs, UA  Treatments: none immediately   Varney Daily, [place] 01/01/22 1454

## 2022-01-01 NOTE — Discharge Instructions (Addendum)
I think that the vomiting is probably a side effect of the tramadol that you are taking for the pain.  If you do not need the tramadol then just do not take it.  If you need it and you get nauseated with it you can try the Zofran melt on your tongue wafers up to 3 times a day to help with the nausea.  If that does not work or if you have any other symptoms or feel weak or lightheaded or develop bad belly pain or have any blood in the vomit please return at once.

## 2022-01-01 NOTE — ED Triage Notes (Signed)
Pt was discharged yesterday and was sent home with Tramadol. Pt took tramadol last night and today. Pt began vomiting today. Pt ia alert and oriented in triage no distress noted.

## 2022-01-01 NOTE — ED Notes (Signed)
Pt visualized eating sandwich tray at this time.

## 2022-01-08 DIAGNOSIS — Z008 Encounter for other general examination: Secondary | ICD-10-CM | POA: Diagnosis not present

## 2022-09-12 DIAGNOSIS — Z1389 Encounter for screening for other disorder: Secondary | ICD-10-CM | POA: Diagnosis not present

## 2022-09-12 DIAGNOSIS — J069 Acute upper respiratory infection, unspecified: Secondary | ICD-10-CM | POA: Diagnosis not present

## 2023-07-25 DIAGNOSIS — Z008 Encounter for other general examination: Secondary | ICD-10-CM | POA: Diagnosis not present

## 2023-08-28 ENCOUNTER — Encounter: Payer: Self-pay | Admitting: Emergency Medicine

## 2023-08-28 ENCOUNTER — Emergency Department
Admission: EM | Admit: 2023-08-28 | Discharge: 2023-08-28 | Disposition: A | Discharge status: Home / Self Care | Attending: Emergency Medicine | Admitting: Emergency Medicine

## 2023-08-28 ENCOUNTER — Emergency Department

## 2023-08-28 ENCOUNTER — Other Ambulatory Visit: Payer: Self-pay

## 2023-08-28 DIAGNOSIS — M79661 Pain in right lower leg: Secondary | ICD-10-CM | POA: Diagnosis not present

## 2023-08-28 DIAGNOSIS — M79604 Pain in right leg: Secondary | ICD-10-CM | POA: Diagnosis not present

## 2023-08-28 LAB — CBC
HCT: 37.7 % (ref 36.0–46.0)
Hemoglobin: 12.3 g/dL (ref 12.0–15.0)
MCH: 30.7 pg (ref 26.0–34.0)
MCHC: 32.6 g/dL (ref 30.0–36.0)
MCV: 94 fL (ref 80.0–100.0)
Platelets: 317 10*3/uL (ref 150–400)
RBC: 4.01 MIL/uL (ref 3.87–5.11)
RDW: 12.3 % (ref 11.5–15.5)
WBC: 4.1 10*3/uL (ref 4.0–10.5)
nRBC: 0 % (ref 0.0–0.2)

## 2023-08-28 LAB — COMPREHENSIVE METABOLIC PANEL WITH GFR
ALT: 14 U/L (ref 0–44)
AST: 20 U/L (ref 15–41)
Albumin: 4.1 g/dL (ref 3.5–5.0)
Alkaline Phosphatase: 86 U/L (ref 38–126)
Anion gap: 6 (ref 5–15)
BUN: 14 mg/dL (ref 8–23)
CO2: 27 mmol/L (ref 22–32)
Calcium: 8.9 mg/dL (ref 8.9–10.3)
Chloride: 104 mmol/L (ref 98–111)
Creatinine, Ser: 0.69 mg/dL (ref 0.44–1.00)
GFR, Estimated: >60 mL/min (ref >60)
Glucose, Bld: 112 mg/dL — ABNORMAL HIGH (ref 70–99)
Potassium: 3.9 mmol/L (ref 3.5–5.1)
Sodium: 137 mmol/L (ref 135–145)
Total Bilirubin: 1 mg/dL (ref 0.0–1.2)
Total Protein: 7.7 g/dL (ref 6.5–8.1)

## 2023-08-28 MED ORDER — ACETAMINOPHEN 500 MG PO TABS
1000.0000 mg | ORAL_TABLET | Freq: Once | ORAL | Status: AC
  Administered 2023-08-28: 1000 mg via ORAL
  Filled 2023-08-28: qty 2

## 2023-08-28 NOTE — ED Notes (Signed)
Blue top sent to lab if needed.

## 2023-08-28 NOTE — ED Notes (Signed)
 See triage note  Presents with pain to right lower leg  States pain starts below the knee and moves into ankle area   No swelling  Denies any injury  Ambulate well

## 2023-08-28 NOTE — ED Provider Notes (Signed)
 [facility] Provider Note    Event Date/Time   First MD Initiated Contact with Patient 08/28/23 0935     (approximate)   History   Leg Pain   HPI  Erika Roman is a 83 y.o. female who presents to the ED for evaluation of Leg Pain   I reviewed medical DC summary from 12/2021.  Admitted for right leg pain, thought to be limb ischemia due to abnormal CT angiogram in the ED but once evaluated by vascular surgery, not thought to be of vascular etiology.  Patient returns to the ED due to recurrence of right lower leg pain over the past few weeks.  No trauma or injuries.  Pain is worse at night.  No worsening of the pain when she is ambulating.  No fevers, rash, swelling.   She had tried Tylenol  which helped quite a bit but she reports stopping after 1 week because someone told her she could not take it for longer than 1 week.   Physical Exam   Triage Vital Signs: ED Triage Vitals  Encounter Vitals Group     BP 08/28/23 0911 (!) 152/82     Systolic BP Percentile --      Diastolic BP Percentile --      Pulse Rate 08/28/23 0911 80     Resp 08/28/23 0911 16     Temp 08/28/23 0911 98.4 F (36.9 C)     Temp Source 08/28/23 0911 Oral     SpO2 08/28/23 0911 98 %     Weight 08/28/23 0918 150 lb (68 kg)     Height 08/28/23 0918 5\' 3"  (1.6 m)     Head Circumference --      Peak Flow --      Pain Score 08/28/23 0913 10     Pain Loc --      Pain Education --      Exclude from Growth Chart --     Most recent vital signs: Vitals:   08/28/23 0911  BP: (!) 152/82  Pulse: 80  Resp: 16  Temp: 98.4 F (36.9 C)  SpO2: 98%    General: Awake, no distress.  CV:  Good peripheral perfusion.  Resp:  Normal effort.  Abd:  No distention.  MSK:  No deformity noted.  Neuro:  No focal deficits appreciated. Other:  Symmetric right lower leg when compared to the left.  Brisk capillary refill and symmetric pulses.  No signs of ischemia.  No swelling, rash or signs of  trauma.  Has some mild calf tenderness on the left.   ED Results / Procedures / Treatments   Labs (all labs ordered are listed, but only abnormal results are displayed) Labs Reviewed  COMPREHENSIVE METABOLIC PANEL WITH GFR - Abnormal; Notable for the following components:      Result Value   Glucose, Bld 112 (*)    All other components within normal limits  CBC    EKG   RADIOLOGY   Official radiology report(s): US  Venous Img Lower Unilateral Right Result Date: 08/28/2023 CLINICAL DATA:  Right calf pain x 2 weeks EXAM: Right LOWER EXTREMITY VENOUS DOPPLER ULTRASOUND TECHNIQUE: Gray-scale sonography with compression, as well as color and duplex ultrasound, were performed to evaluate the deep venous system(s) from the level of the common femoral vein through the popliteal and proximal calf veins. COMPARISON:  None Available. FINDINGS: VENOUS Normal compressibility of the common femoral, superficial femoral, and popliteal veins, as well as the visualized calf  veins. Visualized portions of profunda femoral vein and great saphenous vein unremarkable. No filling defects to suggest DVT on grayscale or color Doppler imaging. Doppler waveforms show normal direction of venous flow, normal respiratory plasticity and response to augmentation. Limited views of the contralateral common femoral vein are unremarkable. OTHER None. Limitations: none IMPRESSION: No evidence of right lower extremity DVT. Electronically Signed   By: Adrianna Horde M.D.   On: 08/28/2023 11:45    PROCEDURES and INTERVENTIONS:  Procedures  Medications  acetaminophen  (TYLENOL ) tablet 1,000 mg (1,000 mg Oral Given 08/28/23 1139)     IMPRESSION / MDM / ASSESSMENT AND PLAN / ED COURSE  I reviewed the triage vital signs and the nursing notes.  Differential diagnosis includes, but is not limited to, arthritis, muscular strain or spasm, DVT, limb ischemia, shingles  {Patient presents with symptoms of an acute illness or injury  that is potentially life-threatening.  Patient presents with acute on chronic right leg pain with a normal exam and suitable for outpatient management.  She look systemically well.  Normal CBC and metabolic panel.  No signs of DVT.  Pain resolves with Tylenol .  Suspect MSK pain.  Clinical Course as of 08/28/23 1153  Fri Aug 28, 2023  1153 Reassessed.  Pain resolved with Tylenol .  We discussed reassuring ultrasound and possible etiologies of symptoms.  Discussed ED return precautions and clinic follow-up. [DS]    Clinical Course User Index [DS] Arline Bennett, MD     FINAL CLINICAL IMPRESSION(S) / ED DIAGNOSES   Final diagnoses:  Right leg pain     Rx / DC Orders   ED Discharge Orders     None        Note:  This document was prepared using Dragon voice recognition software and may include unintentional dictation errors.   Arline Bennett, MD 08/28/23 1153

## 2023-08-28 NOTE — ED Triage Notes (Addendum)
 Pt states that her right leg keeps hurting, states not her knee, but her right shin, denies any known injury, states that it hurt last year and had stay in the hospital 3 days for it, denies any blood clots. Left leg slightly warmer than the right, pedal pulse not palpable however found by pedal doppler. Pt drove herself to the ER, ambulatory with a steady gait

## 2023-08-28 NOTE — Discharge Instructions (Signed)
Use Tylenol for pain and fevers.  Up to 1000 mg per dose, up to 4 times per day.  Do not take more than 4000 mg of Tylenol/acetaminophen within 24 hours..  

## 2023-10-26 DIAGNOSIS — Z0000 Encounter for general adult medical examination without abnormal findings: Secondary | ICD-10-CM | POA: Diagnosis not present

## 2023-10-26 DIAGNOSIS — Z1389 Encounter for screening for other disorder: Secondary | ICD-10-CM | POA: Diagnosis not present

## 2023-10-26 DIAGNOSIS — I70209 Unspecified atherosclerosis of native arteries of extremities, unspecified extremity: Secondary | ICD-10-CM | POA: Diagnosis not present

## 2023-10-26 DIAGNOSIS — Z1331 Encounter for screening for depression: Secondary | ICD-10-CM | POA: Diagnosis not present

## 2023-11-17 ENCOUNTER — Encounter (INDEPENDENT_AMBULATORY_CARE_PROVIDER_SITE_OTHER): Admitting: Vascular Surgery

## 2023-12-31 ENCOUNTER — Other Ambulatory Visit: Payer: Self-pay | Admitting: Family Medicine

## 2023-12-31 DIAGNOSIS — Z1231 Encounter for screening mammogram for malignant neoplasm of breast: Secondary | ICD-10-CM

## 2023-12-31 DIAGNOSIS — Z780 Asymptomatic menopausal state: Secondary | ICD-10-CM
